# Patient Record
Sex: Female | Born: 1990 | Race: White | Hispanic: No
Health system: Southern US, Community
[De-identification: ages and names within clinical notes are randomized; demographics above are authoritative.]

## PROBLEM LIST (undated history)

## (undated) DIAGNOSIS — O139 Gestational [pregnancy-induced] hypertension without significant proteinuria, unspecified trimester: Secondary | ICD-10-CM

## (undated) DIAGNOSIS — O10019 Pre-existing essential hypertension complicating pregnancy, unspecified trimester: Secondary | ICD-10-CM

## (undated) HISTORY — DX: Pre-existing essential hypertension complicating pregnancy, unspecified trimester: O10.019

## (undated) HISTORY — DX: Gestational (pregnancy-induced) hypertension without significant proteinuria, unspecified trimester: O13.9

---
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
## 2011-02-26 LAB — DIFFERENTIAL
Basophils Absolute: 0
Eosinophils Relative: 0
Lymphocytes Relative: 6 — ABNORMAL LOW
Lymphs Abs: 0.9 — ABNORMAL LOW
Monocytes Absolute: 0.8
Monocytes Relative: 5

## 2011-02-26 LAB — CBC
HCT: 37.6
Hemoglobin: 12.7
RBC: 4.39
RDW: 13.3

## 2011-02-26 LAB — URINALYSIS, ROUTINE W REFLEX MICROSCOPIC
Glucose, UA: NEGATIVE
Specific Gravity, Urine: 1.015
pH: 8

## 2011-02-26 LAB — CSF CELL COUNT WITH DIFFERENTIAL: WBC, CSF: 3

## 2011-02-26 LAB — CSF CULTURE W GRAM STAIN: Culture: NO GROWTH

## 2011-02-26 LAB — BASIC METABOLIC PANEL
BUN: 7
Chloride: 105
Potassium: 3.6
Sodium: 135

## 2011-02-26 LAB — URINE MICROSCOPIC-ADD ON

## 2011-02-26 LAB — GLUCOSE, CSF: Glucose, CSF: 51

## 2011-02-26 LAB — PROTEIN, CSF: Total  Protein, CSF: 18

## 2011-02-26 LAB — GRAM STAIN

## 2011-10-24 ENCOUNTER — Ambulatory Visit (INDEPENDENT_AMBULATORY_CARE_PROVIDER_SITE_OTHER): Payer: BC Managed Care – PPO | Admitting: Family Medicine

## 2011-10-24 VITALS — BP 129/81 | HR 90 | Temp 98.3°F | Resp 18 | Ht 62.0 in | Wt 171.2 lb

## 2011-10-24 DIAGNOSIS — Z309 Encounter for contraceptive management, unspecified: Secondary | ICD-10-CM

## 2011-10-24 DIAGNOSIS — Z3041 Encounter for surveillance of contraceptive pills: Secondary | ICD-10-CM

## 2011-10-24 MED ORDER — LEVONORGESTREL-ETHINYL ESTRAD 0.1-20 MG-MCG PO TABS: 1.0000 | ORAL_TABLET | Freq: Every day | ORAL | Status: DC

## 2011-10-26 ENCOUNTER — Encounter: Payer: Self-pay | Admitting: Family Medicine

## 2011-10-26 DIAGNOSIS — Z309 Encounter for contraceptive management, unspecified: Secondary | ICD-10-CM | POA: Insufficient documentation

## 2011-10-26 NOTE — Progress Notes (Signed)
  Subjective:    Patient ID: Shelley Pruitt, female    DOB: 1990/10/31, 21 y.o.   MRN: [id number]  HPI  Healthy 21 y.o. Cauc female here for OCP refill. She is compliant with medication and tolerates it  very well without side effects. She has no HA, SOB, CP, palpitations, dizziness, excess weight gain,  moodiness or unusual bleeding or vaginal discharge. Last PAP was less than 2 years ago.    Review of Systems As per HPI     Objective:   Physical Exam  Nursing note and vitals reviewed. Constitutional: She is oriented to person, place, and time. She appears well-developed and well-nourished. No distress.  HENT:  Head: Normocephalic and atraumatic.  Mouth/Throat: Oropharynx is clear and moist.  Eyes: Conjunctivae and EOM are normal. No scleral icterus.  Neck: Neck supple. No thyromegaly present.  Cardiovascular: Normal rate, regular rhythm and normal heart sounds.   No murmur heard. Pulmonary/Chest: Effort normal and breath sounds normal. No respiratory distress.  Abdominal: Soft. She exhibits no mass. There is no tenderness. There is no guarding.  Musculoskeletal: Normal range of motion. She exhibits no edema.  Neurological: She is alert and oriented to person, place, and time. No cranial nerve deficit. Coordination normal.  Skin: Skin is warm and dry.  Psychiatric: She has a normal mood and affect. Her behavior is normal.          Assessment & Plan:   1. Contraception management   2. Uses oral contraception   RF: Levonorgestrel-ethinyl estradiol  0.1- 20 mg-mcg  X 12 months RTC for CPE/PAP within next 12 months.

## 2012-09-29 ENCOUNTER — Other Ambulatory Visit: Payer: Self-pay | Admitting: Family Medicine

## 2012-09-30 ENCOUNTER — Telehealth: Payer: Self-pay

## 2012-09-30 NOTE — Telephone Encounter (Signed)
Was sent in

## 2012-09-30 NOTE — Telephone Encounter (Signed)
Pt's mother is calling to see if her daughter could have one more refill on her birth control that would last her till she has an apt with a new dr Pharmacy is [facility] [address]  Mother is on old HIPPA form i believe its circled that could talk to parents Mothers call back number is [phone]

## 2012-10-01 ENCOUNTER — Telehealth: Payer: Self-pay

## 2012-10-01 NOTE — Telephone Encounter (Signed)
Patients mom is calling to request refill on daughters birth control please call mom janelle

## 2012-10-02 MED ORDER — LEVONORGESTREL-ETHINYL ESTRAD 0.1-20 MG-MCG PO TABS: 1.0000 | ORAL_TABLET | Freq: Every day | ORAL | Status: DC

## 2012-10-02 NOTE — Telephone Encounter (Signed)
Called mom, LMOM Rs sent in advised needs office visit for more refills.

## 2012-10-02 NOTE — Telephone Encounter (Signed)
Ok x 1. Needs office visit before out per Dr. Angelyn Punt note.

## 2018-05-28 ENCOUNTER — Other Ambulatory Visit: Payer: Self-pay | Admitting: Nurse Practitioner

## 2018-05-28 DIAGNOSIS — N6001 Solitary cyst of right breast: Secondary | ICD-10-CM

## 2018-06-16 ENCOUNTER — Ambulatory Visit
Admission: RE | Admit: 2018-06-16 | Discharge: 2018-06-16 | Disposition: A | Discharge status: Home / Self Care | Payer: Managed Care, Other (non HMO) | Source: Ambulatory Visit | Attending: Nurse Practitioner | Admitting: Nurse Practitioner

## 2018-06-16 ENCOUNTER — Other Ambulatory Visit: Payer: Self-pay | Admitting: Nurse Practitioner

## 2018-06-16 ENCOUNTER — Other Ambulatory Visit: Payer: Self-pay | Admitting: Surgery

## 2018-06-16 DIAGNOSIS — N6310 Unspecified lump in the right breast, unspecified quadrant: Secondary | ICD-10-CM

## 2018-06-16 DIAGNOSIS — N6001 Solitary cyst of right breast: Secondary | ICD-10-CM

## 2018-06-21 ENCOUNTER — Other Ambulatory Visit: Payer: Self-pay

## 2018-06-21 ENCOUNTER — Encounter (HOSPITAL_BASED_OUTPATIENT_CLINIC_OR_DEPARTMENT_OTHER): Payer: Self-pay | Admitting: *Deleted

## 2018-06-22 NOTE — Progress Notes (Signed)
Ensure pre surgery drink given with instructions to complete by 1100 dos, pt verbalized understanding. 

## 2018-07-05 ENCOUNTER — Other Ambulatory Visit: Payer: Self-pay | Admitting: Surgery

## 2018-07-05 NOTE — Progress Notes (Signed)
SZP #[id number] - 72 hours no orders

## 2018-07-07 NOTE — H&P (Signed)
  Shelley Pruitt Documented: 06/16/2018 3:40 PM Location: [facility] Patient #: [id number] DOB: [birth year] Married / Language: [place] / Race: White Female   History of Present Illness (Shelley Pruitt A. Magnus Ivan MD; 06/16/2018 4:12 PM) The patient is a 28 year old female who presents with a breast mass. This is a pleasant 28 year old female who was seen here back in November 2018 with a fibroadenoma of the right breast. It is now gotten larger and more symptomatic. Most recent ultrasound shows it to be 9.9 cm in size. She just had an ultrasound-guided biopsy of it several hours ago and I do not have those results yet. There is no family history of breast cancer and she has no nipple discharge. She notices the mass much more now and is becoming uncomfortable.   Allergies [facility], RMA; 06/16/2018 3:41 PM) No Known Allergies [03/19/2017]: Allergies Reconciled   Medication History [facility], RMA; 06/16/2018 3:41 PM) Multi Vitamin (Oral) Active. Medications Reconciled  Vitals (Jacqueline Haggett RMA; 06/16/2018 3:41 PM) 06/16/2018 3:41 PM Weight: 197.8 lb Height: 63in Body Surface Area: 1.92 m Body Mass Index: 35.04 kg/m  Temp.: 97.70F(Temporal)  Pulse: 111 (Regular)  P.OX: 98% (Room air) BP: 162/84 (Sitting, Right Arm, Standard)       Physical Exam (Ziv Welchel A. Magnus Ivan MD; 06/16/2018 4:13 PM) The physical exam findings are as follows: Note:On examination, she is well. Lungs are clear bilaterally Cardiovascular regular rate and rhythm There is a large mobile mass at the 12 to 1 o'clock position of the right breast above the areola. There are no other masses and no axillary adenopathy    Assessment & Plan (Guinevere Stephenson A. 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 Right breast lumpectomy is recommended. She and her family are  a proceed with surgery given her symptoms. I discussed the surgery in detail. I discussed the risk which includes but is not limited to bleeding, infection, recurrence, need for further surgery, cardiopulmonary issues, postoperative recovery, etc. She understands and wishes to proceed with surgery which will be scheduled

## 2018-07-08 ENCOUNTER — Ambulatory Visit (HOSPITAL_BASED_OUTPATIENT_CLINIC_OR_DEPARTMENT_OTHER): Payer: Managed Care, Other (non HMO) | Admitting: Anesthesiology

## 2018-07-08 ENCOUNTER — Other Ambulatory Visit: Payer: Self-pay

## 2018-07-08 ENCOUNTER — Encounter (HOSPITAL_BASED_OUTPATIENT_CLINIC_OR_DEPARTMENT_OTHER)
Admission: RE | Disposition: A | Discharge status: Home / Self Care | Payer: Self-pay | Source: Ambulatory Visit | Attending: Surgery

## 2018-07-08 ENCOUNTER — Encounter (HOSPITAL_BASED_OUTPATIENT_CLINIC_OR_DEPARTMENT_OTHER): Payer: Self-pay

## 2018-07-08 ENCOUNTER — Ambulatory Visit (HOSPITAL_BASED_OUTPATIENT_CLINIC_OR_DEPARTMENT_OTHER)
Admission: RE | Admit: 2018-07-08 | Discharge: 2018-07-08 | Disposition: A | Discharge status: Home / Self Care | Payer: Managed Care, Other (non HMO) | Source: Ambulatory Visit | Attending: Surgery | Admitting: Surgery

## 2018-07-08 DIAGNOSIS — Q859 Phakomatosis, unspecified: Secondary | ICD-10-CM | POA: Insufficient documentation

## 2018-07-08 HISTORY — PX: BREAST LUMPECTOMY: SHX2

## 2018-07-08 SURGERY — BREAST LUMPECTOMY
Anesthesia: General | Site: Breast | Laterality: Right

## 2018-07-08 MED ORDER — BUPIVACAINE-EPINEPHRINE (PF) 0.25% -1:200000 IJ SOLN
INTRAMUSCULAR | Status: DC | PRN
  Administered 2018-07-08: 20 mL

## 2018-07-08 MED ORDER — GABAPENTIN 300 MG PO CAPS
300.0000 mg | ORAL_CAPSULE | ORAL | Status: AC
  Administered 2018-07-08: 300 mg via ORAL

## 2018-07-08 MED ORDER — METOCLOPRAMIDE HCL 5 MG/ML IJ SOLN: 10.0000 mg | Freq: Once | INTRAMUSCULAR | Status: DC | PRN

## 2018-07-08 MED ORDER — LIDOCAINE HCL (CARDIAC) PF 100 MG/5ML IV SOSY
PREFILLED_SYRINGE | INTRAVENOUS | Status: DC | PRN
  Administered 2018-07-08: 100 mg via INTRAVENOUS

## 2018-07-08 MED ORDER — CHLORHEXIDINE GLUCONATE CLOTH 2 % EX PADS: 6.0000 | MEDICATED_PAD | Freq: Once | CUTANEOUS | Status: DC

## 2018-07-08 MED ORDER — FENTANYL CITRATE (PF) 100 MCG/2ML IJ SOLN
INTRAMUSCULAR | Status: AC
  Filled 2018-07-08: qty 2

## 2018-07-08 MED ORDER — FENTANYL CITRATE (PF) 100 MCG/2ML IJ SOLN
50.0000 ug | INTRAMUSCULAR | Status: DC | PRN
  Administered 2018-07-08: 100 ug via INTRAVENOUS

## 2018-07-08 MED ORDER — CELECOXIB 200 MG PO CAPS
200.0000 mg | ORAL_CAPSULE | ORAL | Status: AC
  Administered 2018-07-08: 200 mg via ORAL

## 2018-07-08 MED ORDER — DEXAMETHASONE SODIUM PHOSPHATE 10 MG/ML IJ SOLN
INTRAMUSCULAR | Status: DC | PRN
  Administered 2018-07-08: 8 mg via INTRAVENOUS

## 2018-07-08 MED ORDER — DEXAMETHASONE SODIUM PHOSPHATE 10 MG/ML IJ SOLN
INTRAMUSCULAR | Status: AC
  Filled 2018-07-08: qty 1

## 2018-07-08 MED ORDER — ONDANSETRON HCL 4 MG/2ML IJ SOLN
INTRAMUSCULAR | Status: AC
  Filled 2018-07-08: qty 2

## 2018-07-08 MED ORDER — SCOPOLAMINE 1 MG/3DAYS TD PT72: 1.0000 | MEDICATED_PATCH | Freq: Once | TRANSDERMAL | Status: DC | PRN

## 2018-07-08 MED ORDER — MIDAZOLAM HCL 2 MG/2ML IJ SOLN
INTRAMUSCULAR | Status: AC
  Filled 2018-07-08: qty 2

## 2018-07-08 MED ORDER — CEFAZOLIN SODIUM-DEXTROSE 2-4 GM/100ML-% IV SOLN
INTRAVENOUS | Status: AC
  Filled 2018-07-08: qty 100

## 2018-07-08 MED ORDER — MIDAZOLAM HCL 2 MG/2ML IJ SOLN
1.0000 mg | INTRAMUSCULAR | Status: DC | PRN
  Administered 2018-07-08: 2 mg via INTRAVENOUS

## 2018-07-08 MED ORDER — PROPOFOL 10 MG/ML IV BOLUS
INTRAVENOUS | Status: DC | PRN
  Administered 2018-07-08: 200 mg via INTRAVENOUS

## 2018-07-08 MED ORDER — ACETAMINOPHEN 500 MG PO TABS
1000.0000 mg | ORAL_TABLET | ORAL | Status: AC
  Administered 2018-07-08: 1000 mg via ORAL

## 2018-07-08 MED ORDER — CEFAZOLIN SODIUM-DEXTROSE 2-4 GM/100ML-% IV SOLN
2.0000 g | INTRAVENOUS | Status: AC
  Administered 2018-07-08: 2 g via INTRAVENOUS

## 2018-07-08 MED ORDER — ACETAMINOPHEN 500 MG PO TABS
ORAL_TABLET | ORAL | Status: AC
  Filled 2018-07-08: qty 2

## 2018-07-08 MED ORDER — FENTANYL CITRATE (PF) 100 MCG/2ML IJ SOLN: 25.0000 ug | INTRAMUSCULAR | Status: DC | PRN

## 2018-07-08 MED ORDER — OXYCODONE HCL 5 MG PO TABS: 5.0000 mg | ORAL_TABLET | Freq: Four times a day (QID) | ORAL | 0 refills | Status: DC | PRN

## 2018-07-08 MED ORDER — MEPERIDINE HCL 25 MG/ML IJ SOLN: 6.2500 mg | INTRAMUSCULAR | Status: DC | PRN

## 2018-07-08 MED ORDER — GABAPENTIN 300 MG PO CAPS
ORAL_CAPSULE | ORAL | Status: AC
  Filled 2018-07-08: qty 1

## 2018-07-08 MED ORDER — LACTATED RINGERS IV SOLN: INTRAVENOUS | Status: DC

## 2018-07-08 MED ORDER — LACTATED RINGERS IV SOLN
INTRAVENOUS | Status: DC
  Administered 2018-07-08: 13:00:00 via INTRAVENOUS

## 2018-07-08 MED ORDER — CELECOXIB 200 MG PO CAPS
ORAL_CAPSULE | ORAL | Status: AC
  Filled 2018-07-08: qty 1

## 2018-07-08 MED ORDER — ONDANSETRON HCL 4 MG/2ML IJ SOLN
INTRAMUSCULAR | Status: DC | PRN
  Administered 2018-07-08: 4 mg via INTRAVENOUS

## 2018-07-08 SURGICAL SUPPLY — 48 items
ADH SKN CLS APL DERMABOND .7 (GAUZE/BANDAGES/DRESSINGS) ×1
BLADE HEX COATED 2.75 (ELECTRODE) ×3 IMPLANT
BLADE SURG 15 STRL LF DISP TIS (BLADE) ×1 IMPLANT
BLADE SURG 15 STRL SS (BLADE) ×3
CANISTER SUCT 1200ML W/VALVE (MISCELLANEOUS) ×2 IMPLANT
CHLORAPREP W/TINT 26ML (MISCELLANEOUS) ×3 IMPLANT
CLIP VESOCCLUDE SM WIDE 6/CT (CLIP) IMPLANT
COVER BACK TABLE 60X90IN (DRAPES) ×3 IMPLANT
COVER MAYO STAND STRL (DRAPES) ×3 IMPLANT
COVER WAND RF STERILE (DRAPES) IMPLANT
DECANTER SPIKE VIAL GLASS SM (MISCELLANEOUS) IMPLANT
DERMABOND ADVANCED (GAUZE/BANDAGES/DRESSINGS) ×2
DERMABOND ADVANCED .7 DNX12 (GAUZE/BANDAGES/DRESSINGS) ×1 IMPLANT
DRAPE LAPAROTOMY 100X72 PEDS (DRAPES) ×3 IMPLANT
DRAPE UTILITY XL STRL (DRAPES) ×3 IMPLANT
ELECT REM PT RETURN 9FT ADLT (ELECTROSURGICAL) ×3
ELECTRODE REM PT RTRN 9FT ADLT (ELECTROSURGICAL) ×1 IMPLANT
GAUZE SPONGE 4X4 12PLY STRL LF (GAUZE/BANDAGES/DRESSINGS) ×3 IMPLANT
GLOVE BIO SURGEON STRL SZ 6.5 (GLOVE) ×1 IMPLANT
GLOVE BIO SURGEON STRL SZ7 (GLOVE) ×2 IMPLANT
GLOVE BIO SURGEONS STRL SZ 6.5 (GLOVE) ×1
GLOVE BIOGEL PI IND STRL 7.0 (GLOVE) IMPLANT
GLOVE BIOGEL PI IND STRL 7.5 (GLOVE) IMPLANT
GLOVE BIOGEL PI INDICATOR 7.0 (GLOVE) ×4
GLOVE BIOGEL PI INDICATOR 7.5 (GLOVE) ×2
GLOVE SURG SIGNA 7.5 PF LTX (GLOVE) ×3 IMPLANT
GOWN STRL REUS W/ TWL LRG LVL3 (GOWN DISPOSABLE) ×1 IMPLANT
GOWN STRL REUS W/ TWL XL LVL3 (GOWN DISPOSABLE) ×1 IMPLANT
GOWN STRL REUS W/TWL LRG LVL3 (GOWN DISPOSABLE)
GOWN STRL REUS W/TWL XL LVL3 (GOWN DISPOSABLE) ×9
KIT MARKER MARGIN INK (KITS) ×1 IMPLANT
NDL HYPO 25X1 1.5 SAFETY (NEEDLE) ×1 IMPLANT
NEEDLE HYPO 25X1 1.5 SAFETY (NEEDLE) ×3 IMPLANT
NS IRRIG 1000ML POUR BTL (IV SOLUTION) ×1 IMPLANT
PACK BASIN DAY SURGERY FS (CUSTOM PROCEDURE TRAY) ×3 IMPLANT
PENCIL BUTTON HOLSTER BLD 10FT (ELECTRODE) ×3 IMPLANT
SLEEVE SCD COMPRESS KNEE MED (MISCELLANEOUS) ×2 IMPLANT
SPONGE LAP 4X18 RFD (DISPOSABLE) ×5 IMPLANT
SUT MNCRL AB 4-0 PS2 18 (SUTURE) ×3 IMPLANT
SUT SILK 2 0 SH (SUTURE) ×3 IMPLANT
SUT VIC AB 3-0 SH 27 (SUTURE) ×3
SUT VIC AB 3-0 SH 27X BRD (SUTURE) ×1 IMPLANT
SYR CONTROL 10ML LL (SYRINGE) ×3 IMPLANT
TOWEL GREEN STERILE FF (TOWEL DISPOSABLE) ×3 IMPLANT
TRAY FAXITRON CT DISP (TRAY / TRAY PROCEDURE) IMPLANT
TUBE CONNECTING 20'X1/4 (TUBING) ×1
TUBE CONNECTING 20X1/4 (TUBING) ×1 IMPLANT
YANKAUER SUCT BULB TIP NO VENT (SUCTIONS) ×2 IMPLANT

## 2018-07-08 NOTE — Interval H&P Note (Signed)
History and Physical Interval Note:no change in H and P  07/08/2018 12:59 PM  Shelley Pruitt  has presented today for surgery, with the diagnosis of RIGHT BREAST GIANT FIBROADENOMA  The various methods of treatment have been discussed with the patient and family. After consideration of risks, benefits and other options for treatment, the patient has consented to  Procedure(s): RIGHT BREAST LUMPECTOMY (Right) as a surgical intervention .  The patient's history has been reviewed, patient examined, no change in status, stable for surgery.  I have reviewed the patient's chart and labs.  Questions were answered to the patient's satisfaction.     Abigail Miyamoto

## 2018-07-08 NOTE — Transfer of Care (Signed)
Immediate Anesthesia Transfer of Care Note  Patient: Shelley Pruitt  Procedure(s) Performed: RIGHT BREAST LUMPECTOMY (Right Breast)  Patient Location: PACU  Anesthesia Type:General  Level of Consciousness: awake, alert  and oriented  Airway & Oxygen Therapy: Patient Spontanous Breathing and Patient connected to face mask oxygen  Post-op Assessment: Report given to RN and Post -op Vital signs reviewed and stable  Post vital signs: Reviewed and stable  Last Vitals:  Vitals Value Taken Time  BP    Temp    Pulse    Resp    SpO2      Last Pain:  Vitals:   07/08/18 1229  TempSrc: Oral         Complications: No apparent anesthesia complications

## 2018-07-08 NOTE — Anesthesia Procedure Notes (Signed)
Procedure Name: LMA Insertion Date/Time: 07/08/2018 1:27 PM Performed by: Shanon Payor, CRNA Pre-anesthesia Checklist: Patient identified, Emergency Drugs available, Suction available, Patient being monitored and Timeout performed Patient Re-evaluated:Patient Re-evaluated prior to induction Oxygen Delivery Method: Circle system utilized Preoxygenation: Pre-oxygenation with 100% oxygen Induction Type: IV induction LMA: LMA inserted LMA Size: 4.0 Number of attempts: 1 Placement Confirmation: positive ETCO2 and breath sounds checked- equal and bilateral Tube secured with: Tape Dental Injury: Teeth and Oropharynx as per pre-operative assessment

## 2018-07-08 NOTE — Anesthesia Preprocedure Evaluation (Signed)
Anesthesia Evaluation  Patient identified by MRN, date of birth, ID band Patient awake    Reviewed: Allergy & Precautions, NPO status , Patient's Chart, lab work & pertinent test results  Airway Mallampati: II  TM Distance: >3 FB Neck ROM: Full    Dental no notable dental hx.    Pulmonary neg pulmonary ROS,    Pulmonary exam normal breath sounds clear to auscultation       Cardiovascular negative cardio ROS Normal cardiovascular exam Rhythm:Regular Rate:Normal     Neuro/Psych negative neurological ROS  negative psych ROS   GI/Hepatic negative GI ROS, Neg liver ROS,   Endo/Other  negative endocrine ROS  Renal/GU negative Renal ROS  negative genitourinary   Musculoskeletal negative musculoskeletal ROS (+)   Abdominal   Peds negative pediatric ROS (+)  Hematology negative hematology ROS (+)   Anesthesia Other Findings   Reproductive/Obstetrics negative OB ROS                             Anesthesia Physical Anesthesia Plan  ASA: II  Anesthesia Plan: General   Post-op Pain Management:    Induction: Intravenous  PONV Risk Score and Plan: 3 and Ondansetron, Dexamethasone, Midazolam and Treatment may vary due to age or medical condition  Airway Management Planned: LMA  Additional Equipment:   Intra-op Plan:   Post-operative Plan: Extubation in OR  Informed Consent: I have reviewed the patients History and Physical, chart, labs and discussed the procedure including the risks, benefits and alternatives for the proposed anesthesia with the patient or authorized representative who has indicated his/her understanding and acceptance.     Dental advisory given  Plan Discussed with: CRNA  Anesthesia Plan Comments:         Anesthesia Quick Evaluation  

## 2018-07-08 NOTE — Op Note (Signed)
RIGHT BREAST LUMPECTOMY  Procedure Note  LEINA BAS 07/08/2018   Pre-op Diagnosis: RIGHT BREAST MASS     Post-op Diagnosis: same  Procedure(s): RIGHT BREAST LUMPECTOMY  Surgeon(s): Abigail Miyamoto, MD  Anesthesia: General  Staff:  Circulator: Raliegh Scarlet, RN Scrub Person: Monika Salk, CST; McDonough-Hughes, Maceo Pro, RN  Estimated Blood Loss: Minimal               Specimens: sent to path  Indications: This is a 28 year old female with almost a 10 cm right breast mass.  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  The decision was made to proceed with a right breast lumpectomy  Procedure: The patient was brought to the operating room and identified as correct patient.  She is placed upon the operating table and general anesthesia was induced.  Her right breast was then prepped and draped in usual sterile fashion.  The very large mass was at the 12 and 1 o'clock position of the right breast.  I anesthetized the skin around the areola with Marcaine.  I then made a circumareolar incision with a scalpel.  I then dissected down to the breast tissue with electrocautery.  I then dissected superiorly with the cautery and identified the large mass.  I excised the mass in its entirety with the electrocautery.  I was then able to elevated up out of the incision and complete the excision with the cautery.  The mass was soft.  It was sent to pathology for evaluation.  No other abnormalities were identified.  I achieved hemostasis with the cautery.  I irrigated the wound with saline.  I then anesthetized further with Marcaine.  I then closed subcutaneous tissue with interrupted 3-0 Vicryl sutures and closed the skin with a running 4-0 Monocryl.  Dermabond was then applied.  The patient tolerated the procedure well.  All sponge needle and instrument counts were correct at the end of the procedure.  The patient was then extubated in the operating room and taken a stable  condition to the recovery room.          Abigail Miyamoto   Date: 07/08/2018  Time: 2:03 PM

## 2018-07-08 NOTE — Discharge Instructions (Signed)
[facility] Office Phone Number [phone]  BREAST BIOPSY/ LUMPECTOMY: POST OP INSTRUCTIONS  Always review your discharge instruction sheet given to you by the facility where your surgery was performed.  IF YOU HAVE DISABILITY OR FAMILY LEAVE FORMS, YOU MUST BRING THEM TO THE OFFICE FOR PROCESSING.  DO NOT GIVE THEM TO YOUR DOCTOR.  1. A prescription for pain medication may be given to you upon discharge.  Take your pain medication as prescribed, if needed.  If narcotic pain medicine is not needed, then you may take acetaminophen (Tylenol) or ibuprofen (Advil) as needed. 2. Take your usually prescribed medications unless otherwise directed 3. If you need a refill on your pain medication, please contact your pharmacy.  They will contact our office to request authorization.  Prescriptions will not be filled after 5pm or on week-ends. 4. You should eat very light the first 24 hours after surgery, such as soup, crackers, pudding, etc.  Resume your normal diet the day after surgery. 5. Most patients will experience some swelling and bruising in the breast.  Ice packs and a good support bra will help.  Swelling and bruising can take several days to resolve.  6. It is common to experience some constipation if taking pain medication after surgery.  Increasing fluid intake and taking a stool softener will usually help or prevent this problem from occurring.  A mild laxative (Milk of Magnesia or Miralax) should be taken according to package directions if there are no bowel movements after 48 hours. 7. Unless discharge instructions indicate otherwise, you may remove your bandages 24-48 hours after surgery, and you may shower at that time.  You may have steri-strips (small skin tapes) in place directly over the incision.  These strips should be left on the skin for 7-10 days.  If your surgeon used skin glue on the incision, you may shower in 24 hours.  The glue will flake off over the next 2-3  weeks.  Any sutures or staples will be removed at the office during your follow-up visit. 8. ACTIVITIES:  You may resume regular daily activities (gradually increasing) beginning the next day.  Wearing a good support bra or sports bra minimizes pain and swelling.  You may have sexual intercourse when it is comfortable. a. You may drive when you no longer are taking prescription pain medication, you can comfortably wear a seatbelt, and you can safely maneuver your car and apply brakes. b. RETURN TO WORK:  ______________________________________________________________________________________ 9. You should see your doctor in the office for a follow-up appointment approximately two weeks after your surgery.  Your doctors nurse will typically make your follow-up appointment when she calls you with your pathology report.  Expect your pathology report 2-3 business days after your surgery.  You may call to check if you do not hear from [place] after three days. 10. OTHER INSTRUCTIONS: 11.  12.  13. OK TO SHOWER STARTING TOMORROW 14. YOU CAN REMOVE THE BINDER LATER TODAY IF YOU WANT TO OR KEEP IT ON FOR COMFORT 15. ICE PACK, TYLENOL, IBUPROFEN ALSO FOR PAIN 16. NO VIGOROUS ACTIVITY FOR ONE WEEKS ________________________   WHEN TO CALL YOUR DOCTOR: 1. Fever over 101.0 2. Nausea and/or vomiting. 3. Extreme swelling or bruising. 4. Continued bleeding from incision. 5. Increased pain, redness, or drainage from the incision.  The clinic staff is available to answer your questions during regular business hours.  Please dont hesitate to call and ask to speak to one of the nurses for clinical  concerns.  If you have a medical emergency, go to the nearest emergency room or call 911.  A surgeon from [facility] is always on call at the hospital.  For further questions, please visit [email or web address]    Post Anesthesia Home Care Instructions  Activity: Get plenty of rest for the remainder  of the day. A responsible individual must stay with you for 24 hours following the procedure.  For the next 24 hours, DO NOT: -Drive a car -Advertising copywriter -Drink alcoholic beverages -Take any medication unless instructed by your physician -Make any legal decisions or sign important papers.  Meals: Start with liquid foods such as gelatin or soup. Progress to regular foods as tolerated. Avoid greasy, spicy, heavy foods. If nausea and/or vomiting occur, drink only clear liquids until the nausea and/or vomiting subsides. Call your physician if vomiting continues.  Special Instructions/Symptoms: Your throat may feel dry or sore from the anesthesia or the breathing tube placed in your throat during surgery. If this causes discomfort, gargle with warm salt water. The discomfort should disappear within 24 hours.  If you had a scopolamine patch placed behind your ear for the management of post- operative nausea and/or vomiting:  1. The medication in the patch is effective for 72 hours, after which it should be removed.  Wrap patch in a tissue and discard in the trash. Wash hands thoroughly with soap and water. 2. You may remove the patch earlier than 72 hours if you experience unpleasant side effects which may include dry mouth, dizziness or visual disturbances. 3. Avoid touching the patch. Wash your hands with soap and water after contact with the patch.

## 2018-07-08 NOTE — Anesthesia Postprocedure Evaluation (Signed)
Anesthesia Post Note  Patient: Shelley Pruitt  Procedure(s) Performed: RIGHT BREAST LUMPECTOMY (Right Breast)     Patient location during evaluation: PACU Anesthesia Type: General Level of consciousness: awake and alert Pain management: pain level controlled Vital Signs Assessment: post-procedure vital signs reviewed and stable Respiratory status: spontaneous breathing, nonlabored ventilation, respiratory function stable and patient connected to nasal cannula oxygen Cardiovascular status: blood pressure returned to baseline and stable Postop Assessment: no apparent nausea or vomiting Anesthetic complications: no    Last Vitals:  Vitals:   07/08/18 1435 07/08/18 1500  BP:  120/69  Pulse: 84 82  Resp: 17 16  Temp:  36.7 C  SpO2: 99% 98%    Last Pain:  Vitals:   07/08/18 1500  TempSrc:   PainSc: 0-No pain                 Phillips Grout

## 2018-07-09 ENCOUNTER — Encounter (HOSPITAL_BASED_OUTPATIENT_CLINIC_OR_DEPARTMENT_OTHER): Payer: Self-pay | Admitting: Surgery

## 2018-10-29 ENCOUNTER — Inpatient Hospital Stay (HOSPITAL_COMMUNITY)
Admission: AD | Admit: 2018-10-29 | Discharge: 2018-10-29 | Disposition: A | Discharge status: Home Health | Payer: Managed Care, Other (non HMO) | Attending: Obstetrics & Gynecology | Admitting: Obstetrics & Gynecology

## 2018-10-29 ENCOUNTER — Other Ambulatory Visit: Payer: Self-pay

## 2018-10-29 DIAGNOSIS — N939 Abnormal uterine and vaginal bleeding, unspecified: Secondary | ICD-10-CM | POA: Insufficient documentation

## 2018-10-29 DIAGNOSIS — Z3202 Encounter for pregnancy test, result negative: Secondary | ICD-10-CM | POA: Diagnosis not present

## 2018-10-29 LAB — URINALYSIS, ROUTINE W REFLEX MICROSCOPIC
Bacteria, UA: NONE SEEN
Bilirubin Urine: NEGATIVE
Glucose, UA: NEGATIVE mg/dL
Ketones, ur: NEGATIVE mg/dL
Leukocytes,Ua: NEGATIVE
Nitrite: NEGATIVE
Protein, ur: 30 mg/dL — AB
RBC / HPF: >50 RBC/hpf — ABNORMAL HIGH (ref 0–5)
Specific Gravity, Urine: 1.023 (ref 1.005–1.030)
pH: 6 (ref 5.0–8.0)

## 2018-10-29 LAB — CBC
HCT: 42 % (ref 36.0–46.0)
Hemoglobin: 14 g/dL (ref 12.0–15.0)
MCH: 28.7 pg (ref 26.0–34.0)
MCHC: 33.3 g/dL (ref 30.0–36.0)
MCV: 86.2 fL (ref 80.0–100.0)
Platelets: 386 10*3/uL (ref 150–400)
RBC: 4.87 MIL/uL (ref 3.87–5.11)
RDW: 12.7 % (ref 11.5–15.5)
WBC: 9.2 10*3/uL (ref 4.0–10.5)
nRBC: 0 % (ref 0.0–0.2)

## 2018-10-29 LAB — ABO/RH: ABO/RH(D): A POS

## 2018-10-29 LAB — HCG, QUANTITATIVE, PREGNANCY: hCG, Beta Chain, Quant, S: 3 m[IU]/mL (ref <5)

## 2018-10-29 LAB — POCT PREGNANCY, URINE: Preg Test, Ur: NEGATIVE

## 2018-10-29 NOTE — MAU Note (Signed)
Patient states had positive pregnancy test at MD Monday.  Started bleeding on Wed, called MD.  MD said to monitor since she had intercourse on Tues.  Bleeding continued to increase so she came in.  Saturating pad every 2 hours with stringy clots

## 2018-10-29 NOTE — MAU Note (Signed)
Positive HPT. Started bleeding, denies pain.very anxious

## 2018-10-29 NOTE — Discharge Instructions (Signed)
Vaginal Bleeding During Pregnancy, First Trimester ° °A small amount of bleeding from the vagina (spotting) is relatively common during early pregnancy. It usually stops on its own. Various things may cause bleeding or spotting during early pregnancy. Some bleeding may be related to the pregnancy, and some may not. In many cases, the bleeding is normal and is not a problem. However, bleeding can also be a sign of something serious. Be sure to tell your health care provider about any vaginal bleeding right away. °Some possible causes of vaginal bleeding during the first trimester include: °· Infection or inflammation of the cervix. °· Growths (polyps) on the cervix. °· Miscarriage or threatened miscarriage. °· Pregnancy tissue developing outside of the uterus (ectopic pregnancy). °· A mass of tissue developing in the uterus due to an egg being fertilized incorrectly (molar pregnancy). °Follow these instructions at home: °Activity °· Follow instructions from your health care provider about limiting your activity. Ask what activities are safe for you. °· If needed, make plans for someone to help with your regular activities. °· Do not have sex or orgasms until your health care provider says that this is safe. °General instructions °· Take over-the-counter and prescription medicines only as told by your health care provider. °· Pay attention to any changes in your symptoms. °· Do not use tampons or douche. °· Write down how many pads you use each day, how often you change pads, and how soaked (saturated) they are. °· If you pass any tissue from your vagina, save the tissue so you can show it to your health care provider. °· Keep all follow-up visits as told by your health care provider. This is important. °Contact a health care provider if: °· You have vaginal bleeding during any part of your pregnancy. °· You have cramps or labor pains. °· You have a fever. °Get help right away if: °· You have severe cramps in your  back or abdomen. °· You pass large clots or a large amount of tissue from your vagina. °· Your bleeding increases. °· You feel light-headed or weak, or you faint. °· You have chills. °· You are leaking fluid or have a gush of fluid from your vagina. °Summary °· A small amount of bleeding (spotting) from the vagina is relatively common during early pregnancy. °· Various things may cause bleeding or spotting in early pregnancy. °· Be sure to tell your health care provider about any vaginal bleeding right away. °This information is not intended to replace advice given to you by your health care provider. Make sure you discuss any questions you have with your health care provider. °Document Released: 02/05/2005 Document Revised: 07/31/2016 Document Reviewed: 07/31/2016 °Elsevier Interactive Patient Education © 2019 [employer] ° °

## 2018-10-29 NOTE — MAU Provider Note (Signed)
Chief Complaint: Vaginal Bleeding and Possible Pregnancy   First Provider Initiated Contact with Patient 10/29/18 1304     SUBJECTIVE HPI: Shelley Pruitt is a 28 y.o. No obstetric history on file. at Unknown who presents to Maternity Admissions reporting vaginal bleeding. Had a positive pregnancy test at home on Monday. Has not been seen in office yet but normally sees Dr. Richardson Doppole for her gyn care.  Started having vaginal bleeding yesterday morning. States bleeding is like a light period and has had some tiny blood clots. Blood is pink. Not saturating pads. Denies abdominal pain. LMP 5/18.  No past medical history on file. OB History  No obstetric history on file.   Past Surgical History:  Procedure Laterality Date  . BREAST LUMPECTOMY Right 07/08/2018   Procedure: RIGHT BREAST LUMPECTOMY;  Surgeon: Abigail MiyamotoBlackman, Douglas, MD;  Location: [facility];  Service: General;  Laterality: Right;   Social History   Socioeconomic History  . Marital status: Married    Spouse name: Not on file  . Number of children: Not on file  . Years of education: Not on file  . Highest education level: Not on file  Occupational History  . Not on file  Social Needs  . Financial resource strain: Not on file  . Food insecurity    Worry: Not on file    Inability: Not on file  . Transportation needs    Medical: Not on file    Non-medical: Not on file  Tobacco Use  . Smoking status: Never Smoker  . Smokeless tobacco: Never Used  Substance and Sexual Activity  . Alcohol use: Never    Frequency: Never  . Drug use: No  . Sexual activity: Yes    Birth control/protection: Pill, None  Lifestyle  . Physical activity    Days per week: Not on file    Minutes per session: Not on file  . Stress: Not on file  Relationships  . Social Musicianconnections    Talks on phone: Not on file    Gets together: Not on file    Attends religious service: Not on file    Active member of club or organization: Not on file     Attends meetings of clubs or organizations: Not on file    Relationship status: Not on file  . Intimate partner violence    Fear of current or ex partner: Not on file    Emotionally abused: Not on file    Physically abused: Not on file    Forced sexual activity: Not on file  Other Topics Concern  . Not on file  Social History Narrative  . Not on file   No family history on file. No current facility-administered medications on file prior to encounter.    Current Outpatient Medications on File Prior to Encounter  Medication Sig Dispense Refill  . Multiple Vitamin (MULTIVITAMIN) capsule Take 1 capsule by mouth daily.    Marland Kitchen. oxyCODONE (OXY IR/ROXICODONE) 5 MG immediate release tablet Take 1 tablet (5 mg total) by mouth every 6 (six) hours as needed for moderate pain or severe pain. 25 tablet 0   No Known Allergies  I have reviewed patient's Past Medical Hx, Surgical Hx, Family Hx, Social Hx, medications and allergies.   Review of Systems  Constitutional: Negative.   Gastrointestinal: Negative.   Genitourinary: Positive for vaginal bleeding.    OBJECTIVE Patient Vitals for the past 24 hrs:  BP Temp Temp src Pulse Resp SpO2 Height Weight  10/29/18  1343 (!) 148/82 - - 86 18 100 % - -  10/29/18 1251 (!) 150/93 98 F (36.7 C) Oral (!) 104 20 100 % 5' 2.4" (1.585 m) 88.5 kg   Constitutional: Well-developed, well-nourished female in no acute distress.  Cardiovascular: normal rate & rhythm, no murmur Respiratory: normal rate and effort. Lung sounds clear throughout GI: Abd soft, non-tender, Pos BS x 4. No guarding or rebound tenderness MS: Extremities nontender, no edema, normal ROM Neurologic: Alert and oriented x 4.   LAB RESULTS Results for orders placed or performed during the hospital encounter of 10/29/18 (from the past 24 hour(s))  Urinalysis, Routine w reflex microscopic     Status: Abnormal   Collection Time: 10/29/18 12:57 PM  Result Value Ref Range   Color, Urine  YELLOW YELLOW   APPearance CLEAR CLEAR   Specific Gravity, Urine 1.023 1.005 - 1.030   pH 6.0 5.0 - 8.0   Glucose, UA NEGATIVE NEGATIVE mg/dL   Hgb urine dipstick LARGE (A) NEGATIVE   Bilirubin Urine NEGATIVE NEGATIVE   Ketones, ur NEGATIVE NEGATIVE mg/dL   Protein, ur 30 (A) NEGATIVE mg/dL   Nitrite NEGATIVE NEGATIVE   Leukocytes,Ua NEGATIVE NEGATIVE   RBC / HPF >50 (H) 0 - 5 RBC/hpf   WBC, UA 0-5 0 - 5 WBC/hpf   Bacteria, UA NONE SEEN NONE SEEN   Squamous Epithelial / LPF 0-5 0 - 5   Mucus PRESENT   Pregnancy, urine POC     Status: None   Collection Time: 10/29/18 12:58 PM  Result Value Ref Range   Preg Test, Ur NEGATIVE NEGATIVE  hCG, quantitative, pregnancy     Status: None   Collection Time: 10/29/18  1:23 PM  Result Value Ref Range   hCG, Beta Chain, Quant, S 3 <5 mIU/mL  ABO/Rh     Status: None   Collection Time: 10/29/18  1:23 PM  Result Value Ref Range   ABO/RH(D) A POS    No rh immune globuloin      NOT A RH IMMUNE GLOBULIN CANDIDATE, PT RH POSITIVE Performed at [facility], [address], [place], [place] [address]   CBC     Status: None   Collection Time: 10/29/18  1:23 PM  Result Value Ref Range   WBC 9.2 4.0 - 10.5 K/uL   RBC 4.87 3.87 - 5.11 MIL/uL   Hemoglobin 14.0 12.0 - 15.0 g/dL   HCT 42.0 36.0 - 46.0 %   MCV 86.2 80.0 - 100.0 fL   MCH 28.7 26.0 - 34.0 pg   MCHC 33.3 30.0 - 36.0 g/dL   RDW 12.7 11.5 - 15.5 %   Platelets 386 150 - 400 K/uL   nRBC 0.0 0.0 - 0.2 %    IMAGING No results found.  MAU COURSE Orders Placed This Encounter  Procedures  . Urinalysis, Routine w reflex microscopic  . hCG, quantitative, pregnancy  . CBC  . Pregnancy, urine POC  . ABO/Rh  . Discharge patient   No orders of the defined types were placed in this encounter.   MDM UPT negative HCG 3 RH positive  ASSESSMENT 1. Negative pregnancy test     PLAN Discharge home in stable condition. Likely miscarriage  F/u with [facility] Discussed reasons  to return to MAU  Allergies as of 10/29/2018   No Known Allergies     Medication List    STOP taking these medications   oxyCODONE 5 MG immediate release tablet Commonly known as:  Oxy IR/ROXICODONE     TAKE these medications   multivitamin capsule Take 1 capsule by mouth daily.        Judeth HornLawrence, Akeela Busk, NP 10/29/2018  3:02 PM

## 2018-10-29 NOTE — MAU Note (Signed)
Pt advised on where to go for care by E.Lawrence,NP.

## 2019-01-04 LAB — OB RESULTS CONSOLE GC/CHLAMYDIA
Chlamydia: NEGATIVE
Gonorrhea: NEGATIVE

## 2019-02-01 LAB — OB RESULTS CONSOLE GBS: GBS: POSITIVE

## 2019-02-01 LAB — OB RESULTS CONSOLE RPR: RPR: NONREACTIVE

## 2019-02-01 LAB — OB RESULTS CONSOLE RUBELLA ANTIBODY, IGM
Rubella: IMMUNE
Rubella: IMMUNE

## 2019-02-01 LAB — OB RESULTS CONSOLE HIV ANTIBODY (ROUTINE TESTING)
HIV: NONREACTIVE
HIV: NONREACTIVE

## 2019-02-01 LAB — OB RESULTS CONSOLE ABO/RH: RH Type: POSITIVE

## 2019-02-01 LAB — OB RESULTS CONSOLE GC/CHLAMYDIA
Chlamydia: NEGATIVE
Gonorrhea: NEGATIVE

## 2019-02-01 LAB — OB RESULTS CONSOLE ANTIBODY SCREEN: Antibody Screen: NEGATIVE

## 2019-02-01 LAB — OB RESULTS CONSOLE HEPATITIS B SURFACE ANTIGEN: Hepatitis B Surface Ag: NEGATIVE

## 2019-05-13 NOTE — L&D Delivery Note (Signed)
Delivery Note Shelley Pruitt is a G1P0 at [redacted]w[redacted]d who had a spontaneous delivery at 23:32 on 08/08/2019 a viable female "[name]" was delivered via LOA.  APGAR:  7, 9 ; weight 3731 g (8lb3.6oz)  Admitted for induction of labor for CHTN at [redacted]w[redacted]d. Induced with cytotec x2 and then pitocin and AROM. Progressed normally. Received an epidural for pain management. Pushed for 2 hours 50 minutes. Head delivered at 23:31, shoulder dystocia recognized, and McRoberts, suprapubic pressure were performed, ultimately the left posterior arm was delivered after 55 seconds. No nuchal cord. Baby placed on maternal abdomen. Delayed cord clamping for 60 seconds. Delivery of placenta was spontaneous. Placenta was found to be intact, 3-vessel cord was noted. The fundus was found to be firm and the lower uterine segmant was cleared of clot x1. 1st degree perineal laceration was repaired in the normal sterile fashion with 2-0 vicryl. 10cc of lidocaine was used for additional pain management. Estimated blood loss 500cc. Cord gases sent. Instrument and gauze counts were correct at the end of the procedure.  Placenta status: L&D Cord pH: 7.12, bicarb 21.9 Mom to postpartum.  Baby to Couplet care / Skin to Skin.  Ameir Faria K Taam-Akelman 08/09/2019, 12:04 AM

## 2019-07-19 ENCOUNTER — Other Ambulatory Visit: Payer: Self-pay | Admitting: Obstetrics and Gynecology

## 2019-07-26 ENCOUNTER — Telehealth (HOSPITAL_COMMUNITY): Payer: Self-pay | Admitting: *Deleted

## 2019-07-26 ENCOUNTER — Encounter (HOSPITAL_COMMUNITY): Payer: Self-pay | Admitting: *Deleted

## 2019-07-26 NOTE — Telephone Encounter (Signed)
Preadmission screen  

## 2019-07-28 ENCOUNTER — Telehealth (HOSPITAL_COMMUNITY): Payer: Self-pay | Admitting: *Deleted

## 2019-07-28 NOTE — Telephone Encounter (Signed)
Preadmission screen  

## 2019-07-29 ENCOUNTER — Telehealth (HOSPITAL_COMMUNITY): Payer: Self-pay | Admitting: *Deleted

## 2019-07-29 ENCOUNTER — Encounter (HOSPITAL_COMMUNITY): Payer: Self-pay | Admitting: *Deleted

## 2019-07-29 NOTE — Telephone Encounter (Signed)
Preadmission screen  

## 2019-08-06 ENCOUNTER — Other Ambulatory Visit (HOSPITAL_COMMUNITY)
Admission: RE | Admit: 2019-08-06 | Discharge: 2019-08-06 | Disposition: A | Discharge status: Home / Self Care | Payer: Managed Care, Other (non HMO) | Source: Ambulatory Visit | Attending: Obstetrics and Gynecology | Admitting: Obstetrics and Gynecology

## 2019-08-06 LAB — SARS CORONAVIRUS 2 (TAT 6-24 HRS): SARS Coronavirus 2: NEGATIVE

## 2019-08-08 ENCOUNTER — Other Ambulatory Visit: Payer: Self-pay

## 2019-08-08 ENCOUNTER — Inpatient Hospital Stay (HOSPITAL_COMMUNITY): Payer: Managed Care, Other (non HMO)

## 2019-08-08 ENCOUNTER — Inpatient Hospital Stay (HOSPITAL_COMMUNITY): Payer: Managed Care, Other (non HMO) | Admitting: Anesthesiology

## 2019-08-08 ENCOUNTER — Inpatient Hospital Stay (HOSPITAL_COMMUNITY)
Admission: AD | Admit: 2019-08-08 | Discharge: 2019-08-10 | DRG: 807 | Disposition: A | Discharge status: Home / Self Care | Payer: Managed Care, Other (non HMO) | Attending: Obstetrics & Gynecology | Admitting: Obstetrics & Gynecology

## 2019-08-08 ENCOUNTER — Encounter (HOSPITAL_COMMUNITY): Payer: Self-pay | Admitting: Obstetrics and Gynecology

## 2019-08-08 DIAGNOSIS — Z3A39 39 weeks gestation of pregnancy: Secondary | ICD-10-CM

## 2019-08-08 DIAGNOSIS — E669 Obesity, unspecified: Secondary | ICD-10-CM | POA: Diagnosis present

## 2019-08-08 DIAGNOSIS — Z20822 Contact with and (suspected) exposure to covid-19: Secondary | ICD-10-CM | POA: Diagnosis present

## 2019-08-08 DIAGNOSIS — I10 Essential (primary) hypertension: Secondary | ICD-10-CM | POA: Diagnosis present

## 2019-08-08 DIAGNOSIS — O1002 Pre-existing essential hypertension complicating childbirth: Principal | ICD-10-CM | POA: Diagnosis present

## 2019-08-08 DIAGNOSIS — O99824 Streptococcus B carrier state complicating childbirth: Secondary | ICD-10-CM | POA: Diagnosis present

## 2019-08-08 DIAGNOSIS — O99214 Obesity complicating childbirth: Secondary | ICD-10-CM | POA: Diagnosis present

## 2019-08-08 LAB — COMPREHENSIVE METABOLIC PANEL
ALT: 14 U/L (ref 0–44)
AST: 23 U/L (ref 15–41)
Albumin: 2.5 g/dL — ABNORMAL LOW (ref 3.5–5.0)
Alkaline Phosphatase: 111 U/L (ref 38–126)
Anion gap: 9 (ref 5–15)
BUN: 9 mg/dL (ref 6–20)
CO2: 20 mmol/L — ABNORMAL LOW (ref 22–32)
Calcium: 9 mg/dL (ref 8.9–10.3)
Chloride: 109 mmol/L (ref 98–111)
Creatinine, Ser: 0.74 mg/dL (ref 0.44–1.00)
GFR calc Af Amer: >60 mL/min (ref >60)
GFR calc non Af Amer: >60 mL/min (ref >60)
Glucose, Bld: 78 mg/dL (ref 70–99)
Potassium: 4.3 mmol/L (ref 3.5–5.1)
Sodium: 138 mmol/L (ref 135–145)
Total Bilirubin: 0.2 mg/dL — ABNORMAL LOW (ref 0.3–1.2)
Total Protein: 5.7 g/dL — ABNORMAL LOW (ref 6.5–8.1)

## 2019-08-08 LAB — CBC
HCT: 36.5 % (ref 36.0–46.0)
Hemoglobin: 12.2 g/dL (ref 12.0–15.0)
MCH: 28.8 pg (ref 26.0–34.0)
MCHC: 33.4 g/dL (ref 30.0–36.0)
MCV: 86.1 fL (ref 80.0–100.0)
Platelets: 257 10*3/uL (ref 150–400)
RBC: 4.24 MIL/uL (ref 3.87–5.11)
RDW: 15 % (ref 11.5–15.5)
WBC: 12.8 10*3/uL — ABNORMAL HIGH (ref 4.0–10.5)
nRBC: 0 % (ref 0.0–0.2)

## 2019-08-08 LAB — TYPE AND SCREEN
ABO/RH(D): A POS
Antibody Screen: NEGATIVE

## 2019-08-08 LAB — RPR: RPR Ser Ql: NONREACTIVE

## 2019-08-08 MED ORDER — LACTATED RINGERS IV SOLN
500.0000 mL | Freq: Once | INTRAVENOUS | Status: AC
  Administered 2019-08-08: 500 mL via INTRAVENOUS

## 2019-08-08 MED ORDER — OXYTOCIN 40 UNITS IN NORMAL SALINE INFUSION - SIMPLE MED: 2.5000 [IU]/h | INTRAVENOUS | Status: DC

## 2019-08-08 MED ORDER — SODIUM CHLORIDE 0.9 % IV SOLN
5.0000 10*6.[IU] | Freq: Once | INTRAVENOUS | Status: AC
  Administered 2019-08-08: 01:00:00 5 10*6.[IU] via INTRAVENOUS
  Filled 2019-08-08: qty 5

## 2019-08-08 MED ORDER — LACTATED RINGERS IV SOLN: 500.0000 mL | INTRAVENOUS | Status: DC | PRN

## 2019-08-08 MED ORDER — TERBUTALINE SULFATE 1 MG/ML IJ SOLN: 0.2500 mg | Freq: Once | INTRAMUSCULAR | Status: DC | PRN

## 2019-08-08 MED ORDER — OXYTOCIN 40 UNITS IN NORMAL SALINE INFUSION - SIMPLE MED
1.0000 m[IU]/min | INTRAVENOUS | Status: DC
  Administered 2019-08-08: 2 m[IU]/min via INTRAVENOUS
  Filled 2019-08-08: qty 1000

## 2019-08-08 MED ORDER — LIDOCAINE HCL (PF) 1 % IJ SOLN
30.0000 mL | INTRAMUSCULAR | Status: AC | PRN
  Administered 2019-08-09: 30 mL via SUBCUTANEOUS
  Filled 2019-08-08: qty 30

## 2019-08-08 MED ORDER — ONDANSETRON HCL 4 MG/2ML IJ SOLN
4.0000 mg | Freq: Four times a day (QID) | INTRAMUSCULAR | Status: DC | PRN
  Administered 2019-08-08: 17:00:00 4 mg via INTRAVENOUS
  Filled 2019-08-08: qty 2

## 2019-08-08 MED ORDER — MISOPROSTOL 25 MCG QUARTER TABLET
25.0000 ug | ORAL_TABLET | ORAL | Status: DC | PRN
  Administered 2019-08-08 (×2): 25 ug via VAGINAL
  Filled 2019-08-08 (×2): qty 1

## 2019-08-08 MED ORDER — PENICILLIN G POT IN DEXTROSE 60000 UNIT/ML IV SOLN
3.0000 10*6.[IU] | INTRAVENOUS | Status: DC
  Administered 2019-08-08 (×4): 3 10*6.[IU] via INTRAVENOUS
  Filled 2019-08-08 (×4): qty 50

## 2019-08-08 MED ORDER — ACETAMINOPHEN 325 MG PO TABS: 650.0000 mg | ORAL_TABLET | ORAL | Status: DC | PRN

## 2019-08-08 MED ORDER — SODIUM CHLORIDE (PF) 0.9 % IJ SOLN
INTRAMUSCULAR | Status: DC | PRN
  Administered 2019-08-08: 12 mL/h via EPIDURAL

## 2019-08-08 MED ORDER — SOD CITRATE-CITRIC ACID 500-334 MG/5ML PO SOLN: 30.0000 mL | ORAL | Status: DC | PRN

## 2019-08-08 MED ORDER — EPHEDRINE 5 MG/ML INJ: 10.0000 mg | INTRAVENOUS | Status: DC | PRN

## 2019-08-08 MED ORDER — DIPHENHYDRAMINE HCL 50 MG/ML IJ SOLN: 12.5000 mg | INTRAMUSCULAR | Status: DC | PRN

## 2019-08-08 MED ORDER — OXYTOCIN 40 UNITS IN NORMAL SALINE INFUSION - SIMPLE MED: 1.0000 m[IU]/min | INTRAVENOUS | Status: DC

## 2019-08-08 MED ORDER — LACTATED RINGERS IV SOLN: 500.0000 mL | Freq: Once | INTRAVENOUS | Status: DC

## 2019-08-08 MED ORDER — LIDOCAINE HCL (PF) 1 % IJ SOLN
INTRAMUSCULAR | Status: DC | PRN
  Administered 2019-08-08 (×2): 4 mL via EPIDURAL

## 2019-08-08 MED ORDER — FENTANYL-BUPIVACAINE-NACL 0.5-0.125-0.9 MG/250ML-% EP SOLN
12.0000 mL/h | EPIDURAL | Status: DC | PRN
  Filled 2019-08-08: qty 250

## 2019-08-08 MED ORDER — FENTANYL CITRATE (PF) 100 MCG/2ML IJ SOLN: 50.0000 ug | INTRAMUSCULAR | Status: DC | PRN

## 2019-08-08 MED ORDER — LACTATED RINGERS IV SOLN: INTRAVENOUS | Status: DC

## 2019-08-08 MED ORDER — OXYTOCIN BOLUS FROM INFUSION
500.0000 mL | Freq: Once | INTRAVENOUS | Status: AC
  Administered 2019-08-08: 500 mL via INTRAVENOUS

## 2019-08-08 MED ORDER — PHENYLEPHRINE 40 MCG/ML (10ML) SYRINGE FOR IV PUSH (FOR BLOOD PRESSURE SUPPORT): 80.0000 ug | PREFILLED_SYRINGE | INTRAVENOUS | Status: DC | PRN

## 2019-08-08 NOTE — Anesthesia Procedure Notes (Signed)
Epidural Patient location during procedure: OB Start time: 08/08/2019 1:15 PM End time: 08/08/2019 1:18 PM  Staffing Anesthesiologist: Kaylyn Layer, MD Performed: anesthesiologist   Preanesthetic Checklist Completed: patient identified, IV checked, risks and benefits discussed, monitors and equipment checked, pre-op evaluation and timeout performed  Epidural Patient position: sitting Prep: DuraPrep and site prepped and draped Patient monitoring: continuous pulse ox, blood pressure and heart rate Approach: midline Location: L3-L4 Injection technique: LOR air  Needle:  Needle type: Tuohy  Needle gauge: 17 G Needle length: 9 cm Needle insertion depth: 7 cm Catheter type: closed end flexible Catheter size: 19 Gauge Catheter at skin depth: 12 cm Test dose: negative and Other (1% lidocaine)  Assessment Events: blood not aspirated, injection not painful, no injection resistance, no paresthesia and negative IV test  Additional Notes Patient identified. Risks, benefits, and alternatives discussed with patient including but not limited to bleeding, infection, nerve damage, paralysis, failed block, incomplete pain control, headache, blood pressure changes, nausea, vomiting, reactions to medication, itching, and postpartum back pain. Confirmed with bedside nurse the patient's most recent platelet count. Confirmed with patient that they are not currently taking any anticoagulation, have any bleeding history, or any family history of bleeding disorders. Patient expressed understanding and wished to proceed. All questions were answered. Sterile technique was used throughout the entire procedure. Please see nursing notes for vital signs.   Crisp LOR after one needle redirection. Test dose was given through epidural catheter and negative prior to continuing to dose epidural or start infusion. Warning signs of high block given to the patient including shortness of breath, tingling/numbness in  hands, complete motor block, or any concerning symptoms with instructions to call for help. Patient was given instructions on fall risk and not to get out of bed. All questions and concerns addressed with instructions to call with any issues or inadequate analgesia.  Reason for block:procedure for pain

## 2019-08-08 NOTE — Progress Notes (Signed)
OBGYN Note Vitals:   08/08/19 1331 08/08/19 1336 08/08/19 1341 08/08/19 1346  BP: (!) 152/93 (!) 142/86 (!) 144/92 (!) 149/87  Pulse: 87 69 73 73  Resp:      Temp:      TempSrc:      Weight:      Height:       FHR 130, Cat 1 Toco q29m SVE 4/70/0. AROM with clear fluid  -Cont pitocin, s/p epidural -CHTN, on no meds, BP mild range Shelley Pruitt 08/08/19 2:06 PM

## 2019-08-08 NOTE — Anesthesia Preprocedure Evaluation (Signed)
Anesthesia Evaluation  Patient identified by MRN, date of birth, ID band Patient awake    Reviewed: Allergy & Precautions, Patient's Chart, lab work & pertinent test results  History of Anesthesia Complications Negative for: history of anesthetic complications  Airway Mallampati: II  TM Distance: >3 FB Neck ROM: Full    Dental no notable dental hx.    Pulmonary neg pulmonary ROS,    Pulmonary exam normal        Cardiovascular hypertension, Normal cardiovascular exam     Neuro/Psych negative neurological ROS  negative psych ROS   GI/Hepatic negative GI ROS, Neg liver ROS,   Endo/Other  Morbid obesity  Renal/GU negative Renal ROS  negative genitourinary   Musculoskeletal negative musculoskeletal ROS (+)   Abdominal   Peds  Hematology negative hematology ROS (+)   Anesthesia Other Findings Day of surgery medications reviewed with patient.  Reproductive/Obstetrics (+) Pregnancy                             Anesthesia Physical Anesthesia Plan  ASA: III  Anesthesia Plan: Epidural   Post-op Pain Management:    Induction:   PONV Risk Score and Plan: Treatment may vary due to age or medical condition  Airway Management Planned: Natural Airway  Additional Equipment:   Intra-op Plan:   Post-operative Plan:   Informed Consent: I have reviewed the patients History and Physical, chart, labs and discussed the procedure including the risks, benefits and alternatives for the proposed anesthesia with the patient or authorized representative who has indicated his/her understanding and acceptance.       Plan Discussed with:   Anesthesia Plan Comments:         Anesthesia Quick Evaluation  

## 2019-08-08 NOTE — H&P (Signed)
Shelley Pruitt is a 29 y.o. female G1P0 [redacted]w[redacted]d presenting for scheduled IOL 2/2 CHTN. She reports no LOF, VB, or contractions. Reports normal FM.  Pregnancy c/b 1. CHTN on no meds 2. Obesity BMI 44  OB History    Gravida  1   Para      Term      Preterm      AB      Living        SAB      TAB      Ectopic      Multiple      Live Births             Past Medical History:  Diagnosis Date  . Benign essential hypertension complicating pregnancy   . Pregnancy induced hypertension    Past Surgical History:  Procedure Laterality Date  . BREAST LUMPECTOMY Right 07/08/2018   Procedure: RIGHT BREAST LUMPECTOMY;  Surgeon: Shelley Miyamoto, MD;  Location: [facility];  Service: General;  Laterality: Right;   Family History: family history is not on file. Social History:  reports that she has never smoked. She has never used smokeless tobacco. She reports that she does not drink alcohol or use drugs.     Maternal Diabetes: No Genetic Screening: Declined Maternal Ultrasounds/Referrals: Normal [place] 07/15/2019 EFW 6lb11oz, 81%, posterior placenta Fetal Ultrasounds or other Referrals:  None Maternal Substance Abuse:  No Significant Maternal Medications:  None Significant Maternal Lab Results:  Group B Strep positive Other Comments:  None  Review of Systems Per HPI Exam Physical Exam  Dilation: 2 Effacement (%): 50 Station: -2 Exam by:: Shelley Pruitt, RNC Blood pressure 138/89, pulse 75, temperature 98 F (36.7 C), temperature source Oral, resp. rate 18, height 5' (1.524 m), weight 103.9 kg, last menstrual period 10/28/2018. NAD, resting comfortably Gravid abdomen Fetal testing: FHR 120, Cat 1, toco irritable  Prenatal labs: ABO, Rh:  --/--/A POS (03/29 0027) Antibody: NEG (03/29 0027) Rubella: Immune (09/22 0000) RPR: Nonreactive (09/22 0000)  HBsAg: Negative (09/22 0000)  HIV: Non-reactive (09/22 0000)  GBS: Positive/-- (09/22 0000)    Assessment/Plan: Shelley Pruitt 29 y.o. G1P0 at [redacted]w[redacted]d here for IOL 2/2 CHTN 1. IOL: On admit 2/50/-2, received cytotec x2, plan for pitocin/AROM prn. GBS+ on PCN 2. CHTN: not currently on meds, monitor Bps 3. Obesity: SCDs  Shelley Pruitt 08/08/2019, 9:19 AM

## 2019-08-09 ENCOUNTER — Encounter (HOSPITAL_COMMUNITY): Payer: Self-pay | Admitting: Obstetrics and Gynecology

## 2019-08-09 LAB — CBC
HCT: 34.8 % — ABNORMAL LOW (ref 36.0–46.0)
Hemoglobin: 11.6 g/dL — ABNORMAL LOW (ref 12.0–15.0)
MCH: 28.6 pg (ref 26.0–34.0)
MCHC: 33.3 g/dL (ref 30.0–36.0)
MCV: 85.7 fL (ref 80.0–100.0)
Platelets: 255 10*3/uL (ref 150–400)
RBC: 4.06 MIL/uL (ref 3.87–5.11)
RDW: 14.9 % (ref 11.5–15.5)
WBC: 20.8 10*3/uL — ABNORMAL HIGH (ref 4.0–10.5)
nRBC: 0 % (ref 0.0–0.2)

## 2019-08-09 MED ORDER — PRENATAL MULTIVITAMIN CH
1.0000 | ORAL_TABLET | Freq: Every day | ORAL | Status: DC
  Administered 2019-08-09: 1 via ORAL
  Filled 2019-08-09: qty 1

## 2019-08-09 MED ORDER — SIMETHICONE 80 MG PO CHEW: 80.0000 mg | CHEWABLE_TABLET | ORAL | Status: DC | PRN

## 2019-08-09 MED ORDER — WITCH HAZEL-GLYCERIN EX PADS: 1.0000 "application " | MEDICATED_PAD | CUTANEOUS | Status: DC | PRN

## 2019-08-09 MED ORDER — DOCUSATE SODIUM 100 MG PO CAPS
100.0000 mg | ORAL_CAPSULE | Freq: Two times a day (BID) | ORAL | Status: DC
  Administered 2019-08-09 – 2019-08-10 (×2): 100 mg via ORAL
  Filled 2019-08-09: qty 1

## 2019-08-09 MED ORDER — SODIUM CHLORIDE 0.9% FLUSH: 3.0000 mL | Freq: Two times a day (BID) | INTRAVENOUS | Status: DC

## 2019-08-09 MED ORDER — IBUPROFEN 600 MG PO TABS
600.0000 mg | ORAL_TABLET | Freq: Four times a day (QID) | ORAL | Status: DC
  Administered 2019-08-09 – 2019-08-10 (×5): 600 mg via ORAL
  Filled 2019-08-09 (×5): qty 1

## 2019-08-09 MED ORDER — SODIUM CHLORIDE 0.9 % IV SOLN: 250.0000 mL | INTRAVENOUS | Status: DC | PRN

## 2019-08-09 MED ORDER — OXYCODONE HCL 5 MG PO TABS: 5.0000 mg | ORAL_TABLET | ORAL | Status: DC | PRN

## 2019-08-09 MED ORDER — DIBUCAINE (PERIANAL) 1 % EX OINT
1.0000 "application " | TOPICAL_OINTMENT | CUTANEOUS | Status: DC | PRN
  Filled 2019-08-09: qty 28

## 2019-08-09 MED ORDER — ONDANSETRON HCL 4 MG/2ML IJ SOLN: 4.0000 mg | INTRAMUSCULAR | Status: DC | PRN

## 2019-08-09 MED ORDER — BENZOCAINE-MENTHOL 20-0.5 % EX AERO
1.0000 "application " | INHALATION_SPRAY | CUTANEOUS | Status: DC | PRN
  Administered 2019-08-09: 1 via TOPICAL
  Filled 2019-08-09 (×2): qty 56

## 2019-08-09 MED ORDER — ONDANSETRON HCL 4 MG PO TABS: 4.0000 mg | ORAL_TABLET | ORAL | Status: DC | PRN

## 2019-08-09 MED ORDER — ACETAMINOPHEN 325 MG PO TABS: 650.0000 mg | ORAL_TABLET | ORAL | Status: DC | PRN

## 2019-08-09 MED ORDER — OXYCODONE HCL 5 MG PO TABS: 10.0000 mg | ORAL_TABLET | ORAL | Status: DC | PRN

## 2019-08-09 MED ORDER — COCONUT OIL OIL: 1.0000 "application " | TOPICAL_OIL | Status: DC | PRN

## 2019-08-09 MED ORDER — DIPHENHYDRAMINE HCL 25 MG PO CAPS: 25.0000 mg | ORAL_CAPSULE | Freq: Four times a day (QID) | ORAL | Status: DC | PRN

## 2019-08-09 MED ORDER — TETANUS-DIPHTH-ACELL PERTUSSIS 5-2.5-18.5 LF-MCG/0.5 IM SUSP: 0.5000 mL | Freq: Once | INTRAMUSCULAR | Status: DC

## 2019-08-09 MED ORDER — SODIUM CHLORIDE 0.9% FLUSH: 3.0000 mL | INTRAVENOUS | Status: DC | PRN

## 2019-08-09 NOTE — Lactation Note (Signed)
This note was copied from a baby's chart. Lactation Consultation Note:  Mother is a P87, infant is 66 hours old, infant has shoulder dystocia.  Mother was given [facility] brochure and basic teaching done.  Mother reports that infant is feeding well.  Mother reports that infant just fed.  Reviewed hand expression with mother. Observed large drops of colostrum. Mother  Mothers nipples are erect with compressible breast tissue.  Mother has a scar on her left areola at 12-2 o'clock , from a large benign growth that was removed in February of 2020. She reports that the growth extended to the top of the breast .   Mother to continue to cue base feed infant and feed at least 8-12 times or more in 24 hours and advised to allow for cluster feeding infant as needed.   Mother to continue to due STS. Mother is aware of available LC services at [facility], BFSG'S, OP Dept, and phone # for questions or concerns about breastfeeding.  Mother receptive to all teaching and plan of care.    Patient Name: Shelley Pruitt SLHTD'S Date: 08/09/2019     Maternal Data    Feeding Feeding Type: Breast Fed  LATCH Score Latch: Repeated attempts needed to sustain latch, nipple held in mouth throughout feeding, stimulation needed to elicit sucking reflex.  Audible Swallowing: A few with stimulation  Type of Nipple: Everted at rest and after stimulation  Comfort (Breast/Nipple): Soft / non-tender  Hold (Positioning): No assistance needed to correctly position infant at breast.  LATCH Score: 8  Interventions    Lactation Tools Discussed/Used     Consult Status      Michel Bickers 08/09/2019, 4:00 PM

## 2019-08-09 NOTE — Progress Notes (Signed)
Patient is eating, ambulating, voiding.  Pain control is good.  Appropriate lochia, no complaints.  Vitals:   08/09/19 0117 08/09/19 0205 08/09/19 0255 08/09/19 0748  BP: 139/82 (!) 142/77 (!) 144/81 135/76  Pulse: 90 86 (!) 110 76  Resp: 16 18 18 20   Temp:  98.3 F (36.8 C) 98.2 F (36.8 C) 98.3 F (36.8 C)  TempSrc:  Oral Oral Oral  Weight:      Height:        Fundus firm Abd: soft nontender Ext: +2 edema, no calf tenderenss  Lab Results  Component Value Date   WBC 20.8 (H) 08/09/2019   HGB 11.6 (L) 08/09/2019   HCT 34.8 (L) 08/09/2019   MCV 85.7 08/09/2019   PLT 255 08/09/2019    --/--/A POS (03/29 0027)  A/P Post partum day 1. CHTN- mild and normal BPs, one severe range on admission, labs normal Shoulder dystocia- baby doing well  Routine care.  Expect d/c 3/31.    4/31

## 2019-08-09 NOTE — Progress Notes (Signed)
CSW received consult for history of anxiety. CSW met with MOB to offer support and complete assessment.    MOB sitting up in bed breastfeeding infant, when CSW entered the room. CSW introduced self and explained reason for consult to which MOB expressed understanding. MOB welcoming of Rosine visit and was very pleasant and engaged throughout assessment. 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 MOB stated she keeps a good eye on it and has good communication with her doctor. MOB denied any current medications or counseling, or need for either at this time. CSW provided education regarding the baby blues period vs. perinatal mood disorders, discussed treatment and gave resources for mental health follow up if concerns arise. CSW recommended self-evaluation during the postpartum time period using the New Mom Checklist from Postpartum Progress and encouraged MOB to contact a medical professional if symptoms are noted at any time. MOB did not appear to be displaying any acute mental health symptoms and denied any current SI, HI or DV. MOB reported having a good support system consisting of FOB, her parents and her in-laws.   MOB reported having all essential items for infant once discharged and stated infant would be sleeping in a bassinet once home. CSW provided review of Sudden Infant Death Syndrome (SIDS) precautions and safe sleeping habits.    CSW identifies no further need for intervention and no barriers to discharge at this time.  Elijio Miles, LCSW [facility] [employer] [phone]

## 2019-08-09 NOTE — Anesthesia Postprocedure Evaluation (Signed)
Anesthesia Post Note  Patient: Shelley Pruitt  Procedure(s) Performed: AN AD HOC LABOR EPIDURAL     Patient location during evaluation: Mother Baby Anesthesia Type: Epidural Level of consciousness: awake Pain management: satisfactory to patient Vital Signs Assessment: post-procedure vital signs reviewed and stable Respiratory status: spontaneous breathing Cardiovascular status: stable Anesthetic complications: no    Last Vitals:  Vitals:   08/09/19 0205 08/09/19 0255  BP: (!) 142/77 (!) 144/81  Pulse: 86 (!) 110  Resp: 18 18  Temp: 36.8 C 36.8 C    Last Pain:  Vitals:   08/09/19 0440  TempSrc:   PainSc: 3    Pain Goal:                   [employer]

## 2019-08-09 NOTE — Plan of Care (Signed)
  Problem: Education: Goal: Knowledge of General Education information will improve Description: Including pain rating scale, medication(s)/side effects and non-pharmacologic comfort measures Outcome: Completed/Met   Problem: Pain Managment: Goal: General experience of comfort will improve Outcome: Completed/Met   Problem: Role Relationship: Goal: Ability to demonstrate positive interaction with newborn will improve Outcome: Completed/Met

## 2019-08-09 NOTE — Plan of Care (Signed)
  Problem: Education: Goal: Knowledge of General Education information will improve Description: Including pain rating scale, medication(s)/side effects and non-pharmacologic comfort measures Outcome: Completed/Met   Problem: Pain Managment: Goal: General experience of comfort will improve Outcome: Completed/Met   

## 2019-08-10 NOTE — Discharge Summary (Signed)
Obstetric Discharge Summary Reason for Admission: induction of labor Prenatal Procedures: NST and ultrasound Intrapartum Procedures: spontaneous vaginal delivery Postpartum Procedures: none Complications-Operative and Postpartum: shoulder dystocia Hemoglobin  Date Value Ref Range Status  08/09/2019 11.6 (L) 12.0 - 15.0 g/dL Final   HCT  Date Value Ref Range Status  08/09/2019 34.8 (L) 36.0 - 46.0 % Final    Physical Exam:  General: alert and cooperative Lochia: appropriate   Discharge Diagnoses: Term Pregnancy-delivered and shoulder dystocia and chronic hypertension  Discharge Information: Date: 08/10/2019 Activity: pelvic rest Diet: routine Medications: PNV and Ibuprofen Condition: stable Instructions: refer to practice specific booklet Discharge to: home Follow-up Information    Levi Aland, MD. Schedule an appointment as soon as possible for a visit.   Specialty: Obstetrics and Gynecology Contact information: [address] [place] [address] [phone]           Newborn Data: Live born female  Birth Weight: 8 lb 3.6 oz (3731 g) APGAR: 7, 9  Newborn Delivery   Birth date/time: 08/08/2019 23:32:00 Delivery type: Vaginal, Spontaneous      Home with mother.  Addison Freimuth E 08/10/2019, 10:18 AM

## 2019-08-10 NOTE — ED Provider Notes (Signed)
PPD#2 Pt has no complaints. Strongly desires to go home. She has a b/p cuff at home and agrees to check B/P QID. Lochia -light VS- rare slightly elevated b/p Imp/ Stable Plan/ Will discharge to home and check B/P Qid. Will call if > 140/90. Will return to office in 2 days for B/P check

## 2019-08-10 NOTE — Lactation Note (Addendum)
This note was copied from a baby's chart. Lactation Consultation Note; Mother reports that infant has been cluster feeding .  Assist mother with latching infant on in cross cradle hold. Infant latched with better depth. Observed latch and taught mother to look for good milk transfer.  Mother getting very tired from all night of cluster feeding. Advised mother to  Continue to nap frequently.   Discussed treatment and prevention of engorgement.   Mother to continue to cue base feed infant and feed at least 8-12 times or more in 24 hours and advised to allow for cluster feeding infant as needed.    Mother to continue to due STS. Mother is aware of available LC services at [facility], BFSG'S, OP Dept, and phone # for questions or concerns about breastfeeding.  Mother receptive to all teaching and plan of care.    Patient Name: Shelley Pruitt SWNIO'E Date: 08/10/2019 Reason for consult: Follow-up assessment   Maternal Data    Feeding Feeding Type: Breast Fed  LATCH Score                   Interventions    Lactation Tools Discussed/Used     Consult Status      Michel Bickers 08/10/2019, 11:13 AM

## 2020-10-19 IMAGING — US ULTRASOUND RIGHT BREAST LIMITED
1 series · 12 of 12 positions shown · non-contrast
Comparison: Previous exam(s).

CLINICAL DATA: Follow-up of right breast mass. Per patient are
breast mass is enlarging.

EXAM:
ULTRASOUND OF THE RIGHT BREAST

[Series 1: ultrasound right breast limited · 0.10mm/px · 12 of 12 slices shown]
[im 1/12]
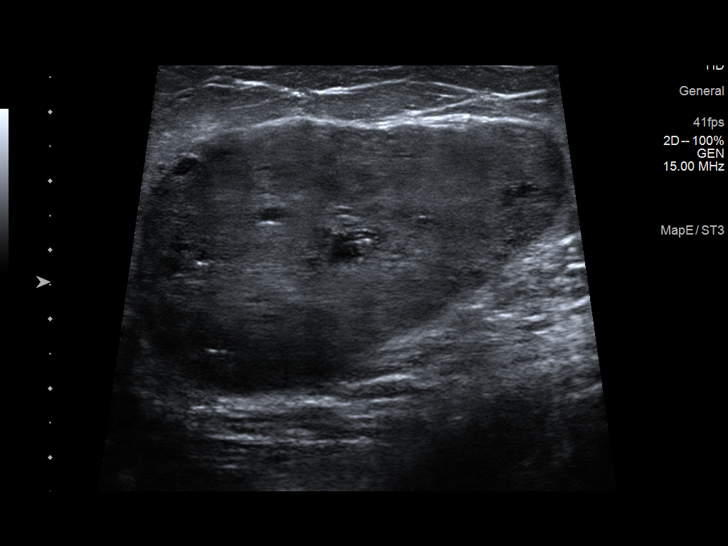
[im 2/12]
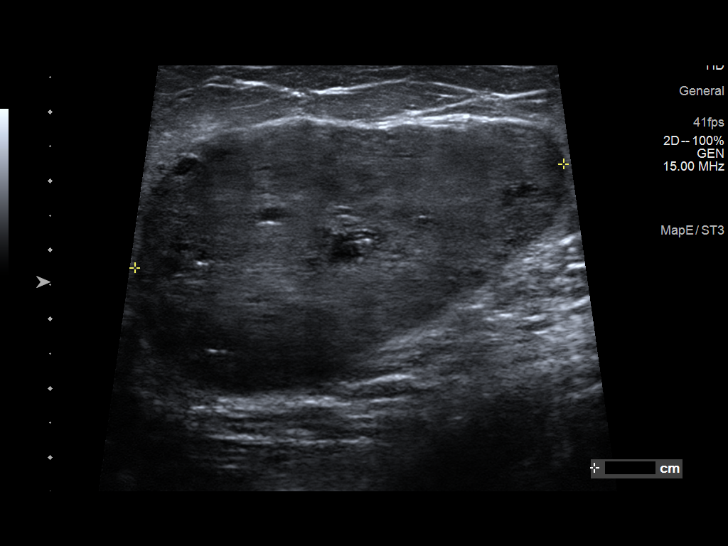
[im 3/12]
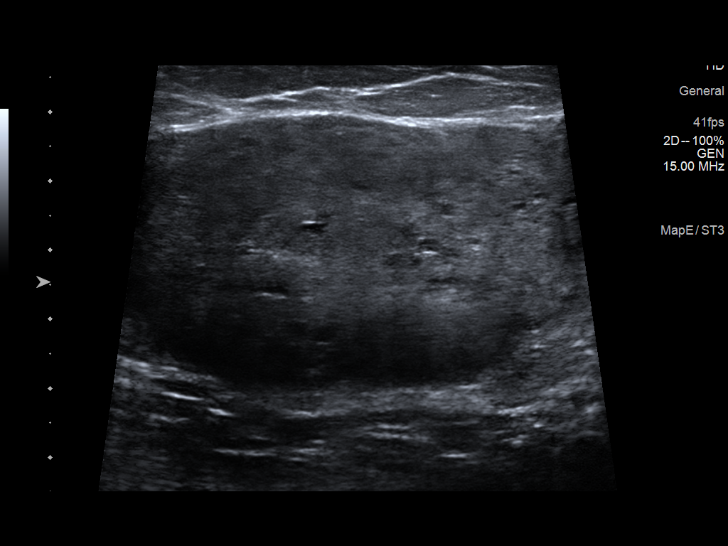
[im 4/12]
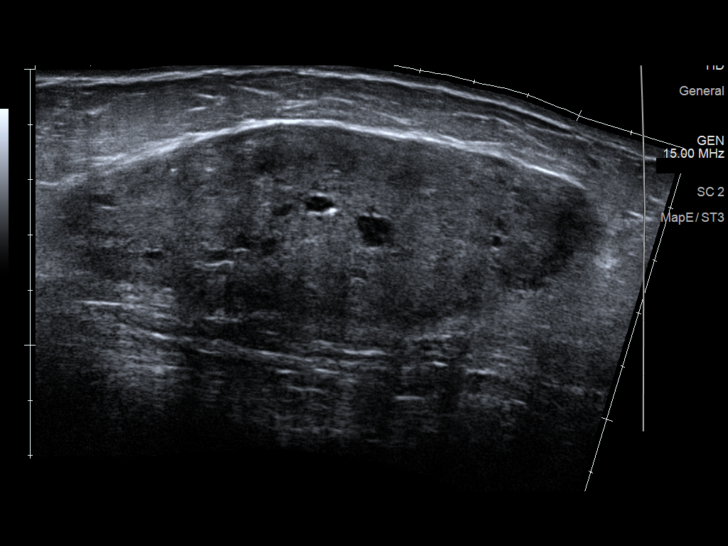
[im 5/12]
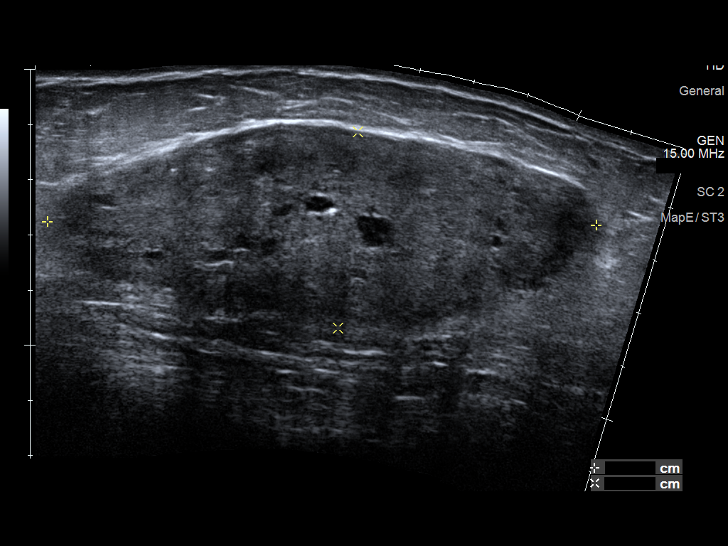
[im 6/12]
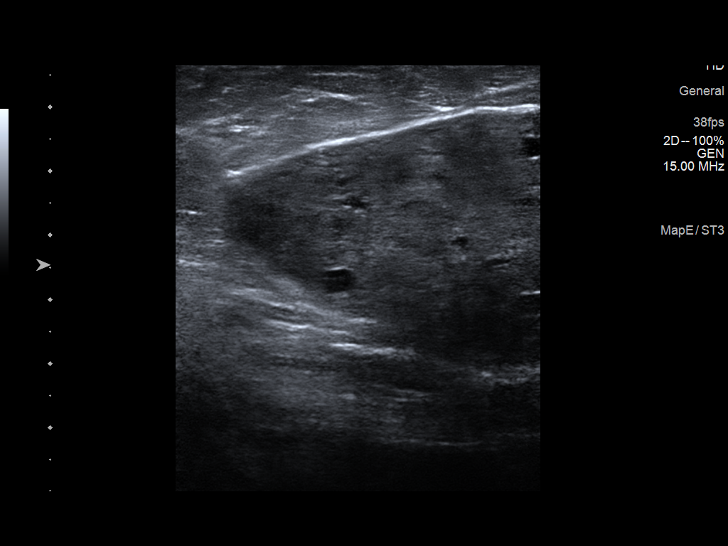
[im 7/12]
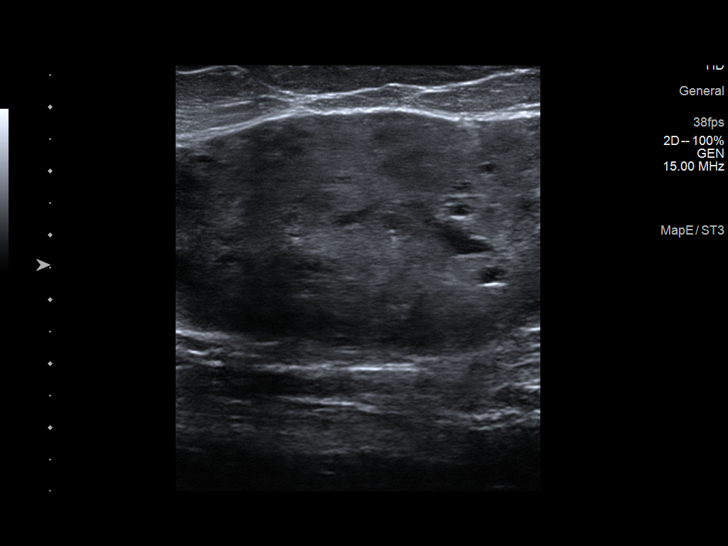
[im 8/12]
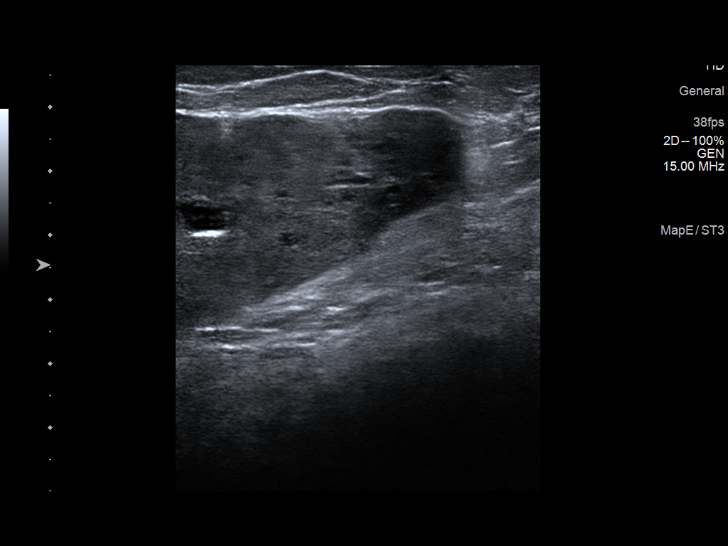
[im 9/12]
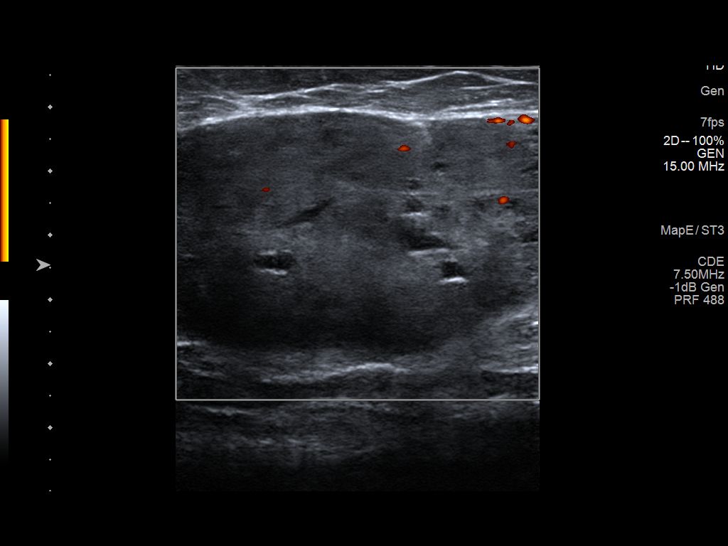
[im 10/12]
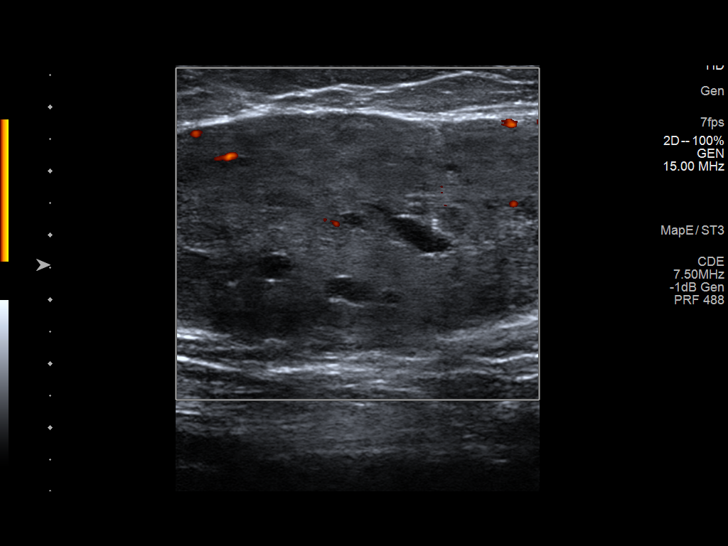
[im 11/12]
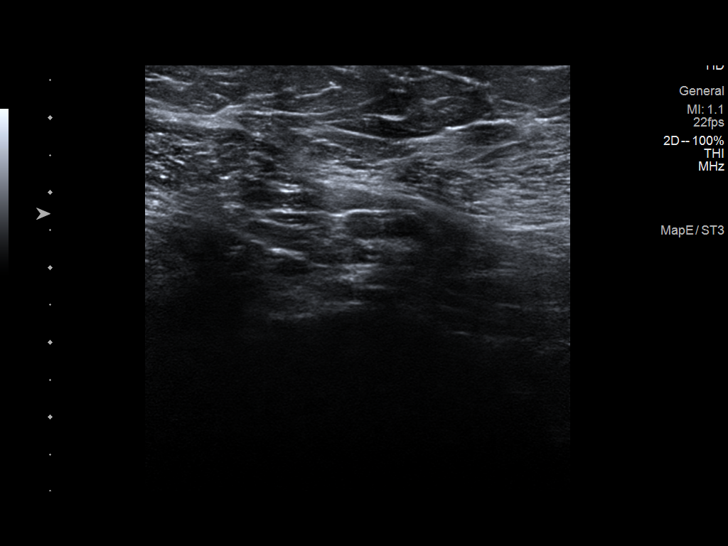
[im 12/12]
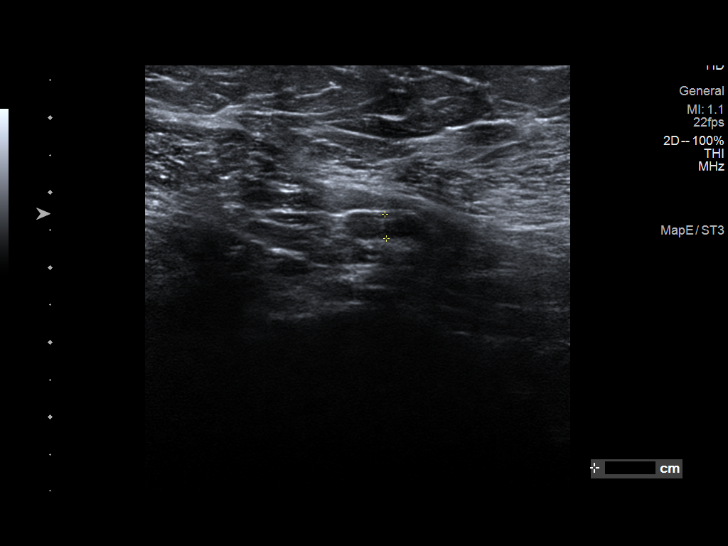

[12 of 12 positions shown; findings below may reference images not displayed]

FINDINGS: On physical exam, there is a freely mobile large circumscribed
moderately firm mass in the upper right breast.

Targeted ultrasound is performed, showing right breast 1 o'clock 7
cm from the nipple hypoechoic circumscribed mass which measures
by 9.9 by 3.6 cm. Cystic spaces are seen within this mass. For
reference, the mass measured 5.4 by 8.0 by 3.0 cm in December 2016.

There is no evidence of right axillary lymphadenopathy.
IMPRESSION: Enlarging 9.9 cm right breast mass, for which ultrasound-guided core
needle biopsy is recommended.

RECOMMENDATION:
Ultrasound-guided core needle biopsy of the right breast 1 o'clock
mass.

I have discussed the findings and recommendations with the patient.
Results were also provided in writing at the conclusion of the
visit. If applicable, a reminder letter will be sent to the patient
regarding the next appointment.

BI-RADS CATEGORY  4: Suspicious.

## 2021-10-15 LAB — OB RESULTS CONSOLE GC/CHLAMYDIA
Chlamydia: NEGATIVE
Neisseria Gonorrhea: NEGATIVE

## 2021-11-13 LAB — OB RESULTS CONSOLE RPR: RPR: NONREACTIVE

## 2021-11-13 LAB — OB RESULTS CONSOLE HEPATITIS B SURFACE ANTIGEN: Hepatitis B Surface Ag: NEGATIVE

## 2021-11-13 LAB — OB RESULTS CONSOLE RUBELLA ANTIBODY, IGM: Rubella: IMMUNE

## 2021-11-13 LAB — HEPATITIS C ANTIBODY: HCV Ab: NEGATIVE

## 2021-11-13 LAB — OB RESULTS CONSOLE HIV ANTIBODY (ROUTINE TESTING): HIV: NONREACTIVE

## 2021-11-13 LAB — OB RESULTS CONSOLE ANTIBODY SCREEN: Antibody Screen: NEGATIVE

## 2022-04-30 LAB — OB RESULTS CONSOLE GBS: GBS: POSITIVE

## 2022-05-12 NOTE — L&D Delivery Note (Signed)
Delivery Note Patient entered active labor and progressed rapidly to 10/100/-1.  Due to significant pressure, pushing was initiated.  She pushed well for < 5 minutes.  At 5:32 PM a viable female was delivered via  (Presentation:   direct OA   ).    Placenta status: Spontaneous, in tact.  Cord: 3V   with the following complications: None .  Cord pH:   Following delivery of the head, the left anterior shoulder did not easily deliver.  Shoulder dystocia was identified and additional assistance was requested.  Patient was instructed to stop pushing.  Was placed in a completely supine position.  McRoberts and suprapubic pressure did not relieve the dystocia.  The right anterior axilla was grasped in a shoulder shrug and with initial rotation, the left anterior shoulder rotated free from under the pubic bone and delivery of the body quickly occurred.  The infant was taken to the warmer for warm-dry stimulation and BBO2.  She was moving all upper extremities.  Following repair, was skin to skin with mom in the delivery room   Delivery of the head: 1731  First maneuver: McRoberts,   Second maneuver: Suprapubic pressure 1732 Third maneuver: Shoulder shrug 1732,   Fourth maneuver: ,   Fifth maneuver: ,   Sixth maneuver: ,    Verbal consent: obtained from patient.  APGAR: 6, 9; weight 4010gm (8lb 13.5oz) .   Marland Kitchen   Placenta status:  Spontaneous, in tact.  Cord: 3V   with the following complications: None  Cord pH: 7.22  pCO2 56  Anesthesia:  Epidural Episiotomy:  None Lacerations: 1st degree;Perineal;Vaginal Suture Repair: 3.0 vicryl rapide Est. Blood Loss (mL): 76  Mom to postpartum.  Baby to Couplet care / Skin to Skin.  [place] 05/21/2022, 5:58 PM  Late addendum:  Of note, when repairing the first degree perineal laceration, there was a small sub-centimeter suture granuloma at the edge of the laceration.  With patient permission, this was excised with mayo scissors prior to closing the  laceration.

## 2022-05-15 ENCOUNTER — Encounter (HOSPITAL_COMMUNITY): Payer: Self-pay | Admitting: *Deleted

## 2022-05-15 ENCOUNTER — Telehealth (HOSPITAL_COMMUNITY): Payer: Self-pay | Admitting: *Deleted

## 2022-05-15 NOTE — Telephone Encounter (Signed)
Preadmission screen  

## 2022-05-21 ENCOUNTER — Inpatient Hospital Stay (HOSPITAL_COMMUNITY)
Admission: RE | Admit: 2022-05-21 | Discharge: 2022-05-22 | DRG: 807 | Disposition: A | Discharge status: Home / Self Care | Payer: No Typology Code available for payment source | Attending: Obstetrics | Admitting: Obstetrics

## 2022-05-21 ENCOUNTER — Inpatient Hospital Stay (HOSPITAL_COMMUNITY): Payer: No Typology Code available for payment source

## 2022-05-21 ENCOUNTER — Other Ambulatory Visit: Payer: Self-pay

## 2022-05-21 ENCOUNTER — Encounter (HOSPITAL_COMMUNITY): Payer: Self-pay | Admitting: Obstetrics

## 2022-05-21 ENCOUNTER — Inpatient Hospital Stay (HOSPITAL_COMMUNITY): Payer: No Typology Code available for payment source | Admitting: Anesthesiology

## 2022-05-21 DIAGNOSIS — Z3A39 39 weeks gestation of pregnancy: Secondary | ICD-10-CM | POA: Diagnosis not present

## 2022-05-21 DIAGNOSIS — I10 Essential (primary) hypertension: Secondary | ICD-10-CM

## 2022-05-21 DIAGNOSIS — O1002 Pre-existing essential hypertension complicating childbirth: Secondary | ICD-10-CM | POA: Diagnosis present

## 2022-05-21 DIAGNOSIS — Z23 Encounter for immunization: Secondary | ICD-10-CM

## 2022-05-21 DIAGNOSIS — O99214 Obesity complicating childbirth: Secondary | ICD-10-CM | POA: Diagnosis present

## 2022-05-21 DIAGNOSIS — O99824 Streptococcus B carrier state complicating childbirth: Secondary | ICD-10-CM | POA: Diagnosis present

## 2022-05-21 DIAGNOSIS — O10013 Pre-existing essential hypertension complicating pregnancy, third trimester: Principal | ICD-10-CM

## 2022-05-21 LAB — CBC
HCT: 38.1 % (ref 36.0–46.0)
HCT: 42.9 % (ref 36.0–46.0)
Hemoglobin: 13 g/dL (ref 12.0–15.0)
Hemoglobin: 14 g/dL (ref 12.0–15.0)
MCH: 28.2 pg (ref 26.0–34.0)
MCH: 27.7 pg (ref 26.0–34.0)
MCHC: 34.1 g/dL (ref 30.0–36.0)
MCHC: 32.6 g/dL (ref 30.0–36.0)
MCV: 82.6 fL (ref 80.0–100.0)
MCV: 84.8 fL (ref 80.0–100.0)
Platelets: 267 10*3/uL (ref 150–400)
Platelets: 272 10*3/uL (ref 150–400)
RBC: 4.61 MIL/uL (ref 3.87–5.11)
RBC: 5.06 MIL/uL (ref 3.87–5.11)
RDW: 15.1 % (ref 11.5–15.5)
RDW: 15.3 % (ref 11.5–15.5)
WBC: 10.2 10*3/uL (ref 4.0–10.5)
WBC: 19.6 10*3/uL — ABNORMAL HIGH (ref 4.0–10.5)
nRBC: 0 % (ref 0.0–0.2)
nRBC: 0 % (ref 0.0–0.2)

## 2022-05-21 LAB — COMPREHENSIVE METABOLIC PANEL
ALT: 16 U/L (ref 0–44)
AST: 23 U/L (ref 15–41)
Albumin: 2.6 g/dL — ABNORMAL LOW (ref 3.5–5.0)
Alkaline Phosphatase: 94 U/L (ref 38–126)
Anion gap: 10 (ref 5–15)
BUN: 6 mg/dL (ref 6–20)
CO2: 20 mmol/L — ABNORMAL LOW (ref 22–32)
Calcium: 8.7 mg/dL — ABNORMAL LOW (ref 8.9–10.3)
Chloride: 105 mmol/L (ref 98–111)
Creatinine, Ser: 0.64 mg/dL (ref 0.44–1.00)
GFR, Estimated: >60 mL/min (ref >60)
Glucose, Bld: 86 mg/dL (ref 70–99)
Potassium: 3.6 mmol/L (ref 3.5–5.1)
Sodium: 135 mmol/L (ref 135–145)
Total Bilirubin: 0.5 mg/dL (ref 0.3–1.2)
Total Protein: 6.1 g/dL — ABNORMAL LOW (ref 6.5–8.1)

## 2022-05-21 LAB — TYPE AND SCREEN
ABO/RH(D): A POS
Antibody Screen: NEGATIVE

## 2022-05-21 LAB — RPR: RPR Ser Ql: NONREACTIVE

## 2022-05-21 MED ORDER — OXYCODONE HCL 5 MG PO TABS: 5.0000 mg | ORAL_TABLET | ORAL | Status: DC | PRN

## 2022-05-21 MED ORDER — LACTATED RINGERS IV SOLN: 500.0000 mL | INTRAVENOUS | Status: DC | PRN

## 2022-05-21 MED ORDER — OXYCODONE-ACETAMINOPHEN 5-325 MG PO TABS: 2.0000 | ORAL_TABLET | ORAL | Status: DC | PRN

## 2022-05-21 MED ORDER — PHENYLEPHRINE 80 MCG/ML (10ML) SYRINGE FOR IV PUSH (FOR BLOOD PRESSURE SUPPORT): 80.0000 ug | PREFILLED_SYRINGE | INTRAVENOUS | Status: DC | PRN

## 2022-05-21 MED ORDER — ACETAMINOPHEN 325 MG PO TABS: 650.0000 mg | ORAL_TABLET | ORAL | Status: DC | PRN

## 2022-05-21 MED ORDER — DIBUCAINE (PERIANAL) 1 % EX OINT: 1.0000 | TOPICAL_OINTMENT | CUTANEOUS | Status: DC | PRN

## 2022-05-21 MED ORDER — INFLUENZA VAC SPLIT QUAD 0.5 ML IM SUSY
0.5000 mL | PREFILLED_SYRINGE | INTRAMUSCULAR | Status: AC
  Administered 2022-05-22: 0.5 mL via INTRAMUSCULAR
  Filled 2022-05-21: qty 0.5

## 2022-05-21 MED ORDER — LACTATED RINGERS IV SOLN
500.0000 mL | Freq: Once | INTRAVENOUS | Status: AC
  Administered 2022-05-21: 500 mL via INTRAVENOUS

## 2022-05-21 MED ORDER — LIDOCAINE HCL (PF) 1 % IJ SOLN
INTRAMUSCULAR | Status: DC | PRN
  Administered 2022-05-21: 5 mL via EPIDURAL
  Administered 2022-05-21: 4 mL via EPIDURAL

## 2022-05-21 MED ORDER — SODIUM CHLORIDE 0.9 % IV SOLN
5.0000 10*6.[IU] | Freq: Once | INTRAVENOUS | Status: AC
  Administered 2022-05-21: 5 10*6.[IU] via INTRAVENOUS
  Filled 2022-05-21: qty 5

## 2022-05-21 MED ORDER — ONDANSETRON HCL 4 MG PO TABS: 4.0000 mg | ORAL_TABLET | ORAL | Status: DC | PRN

## 2022-05-21 MED ORDER — PHENYLEPHRINE 80 MCG/ML (10ML) SYRINGE FOR IV PUSH (FOR BLOOD PRESSURE SUPPORT)
80.0000 ug | PREFILLED_SYRINGE | INTRAVENOUS | Status: DC | PRN
  Filled 2022-05-21: qty 10

## 2022-05-21 MED ORDER — EPHEDRINE 5 MG/ML INJ: 10.0000 mg | INTRAVENOUS | Status: DC | PRN

## 2022-05-21 MED ORDER — PENICILLIN G POT IN DEXTROSE 60000 UNIT/ML IV SOLN
3.0000 10*6.[IU] | INTRAVENOUS | Status: DC
  Administered 2022-05-21 (×2): 3 10*6.[IU] via INTRAVENOUS
  Filled 2022-05-21 (×2): qty 50

## 2022-05-21 MED ORDER — LACTATED RINGERS IV SOLN: INTRAVENOUS | Status: DC

## 2022-05-21 MED ORDER — ONDANSETRON HCL 4 MG/2ML IJ SOLN: 4.0000 mg | INTRAMUSCULAR | Status: DC | PRN

## 2022-05-21 MED ORDER — LIDOCAINE-EPINEPHRINE (PF) 2 %-1:200000 IJ SOLN
INTRAMUSCULAR | Status: DC | PRN
  Administered 2022-05-21: 5 mL via EPIDURAL

## 2022-05-21 MED ORDER — ONDANSETRON HCL 4 MG/2ML IJ SOLN
4.0000 mg | Freq: Four times a day (QID) | INTRAMUSCULAR | Status: DC | PRN
  Administered 2022-05-21: 4 mg via INTRAVENOUS
  Filled 2022-05-21: qty 2

## 2022-05-21 MED ORDER — OXYTOCIN-SODIUM CHLORIDE 30-0.9 UT/500ML-% IV SOLN: 2.5000 [IU]/h | INTRAVENOUS | Status: DC

## 2022-05-21 MED ORDER — SIMETHICONE 80 MG PO CHEW: 80.0000 mg | CHEWABLE_TABLET | ORAL | Status: DC | PRN

## 2022-05-21 MED ORDER — DIPHENHYDRAMINE HCL 50 MG/ML IJ SOLN: 12.5000 mg | INTRAMUSCULAR | Status: DC | PRN

## 2022-05-21 MED ORDER — PRENATAL MULTIVITAMIN CH
1.0000 | ORAL_TABLET | Freq: Every day | ORAL | Status: DC
  Administered 2022-05-22: 1 via ORAL
  Filled 2022-05-21: qty 1

## 2022-05-21 MED ORDER — SENNOSIDES-DOCUSATE SODIUM 8.6-50 MG PO TABS: 2.0000 | ORAL_TABLET | ORAL | Status: DC

## 2022-05-21 MED ORDER — WITCH HAZEL-GLYCERIN EX PADS: 1.0000 | MEDICATED_PAD | CUTANEOUS | Status: DC | PRN

## 2022-05-21 MED ORDER — FENTANYL-BUPIVACAINE-NACL 0.5-0.125-0.9 MG/250ML-% EP SOLN
12.0000 mL/h | EPIDURAL | Status: DC | PRN
  Administered 2022-05-21: 12 mL/h via EPIDURAL
  Filled 2022-05-21: qty 250

## 2022-05-21 MED ORDER — OXYCODONE-ACETAMINOPHEN 5-325 MG PO TABS: 1.0000 | ORAL_TABLET | ORAL | Status: DC | PRN

## 2022-05-21 MED ORDER — SOD CITRATE-CITRIC ACID 500-334 MG/5ML PO SOLN: 30.0000 mL | ORAL | Status: DC | PRN

## 2022-05-21 MED ORDER — OXYTOCIN BOLUS FROM INFUSION
333.0000 mL | Freq: Once | INTRAVENOUS | Status: AC
  Administered 2022-05-21: 333 mL via INTRAVENOUS

## 2022-05-21 MED ORDER — BENZOCAINE-MENTHOL 20-0.5 % EX AERO
1.0000 | INHALATION_SPRAY | CUTANEOUS | Status: DC | PRN
  Administered 2022-05-21: 1 via TOPICAL
  Filled 2022-05-21: qty 56

## 2022-05-21 MED ORDER — IBUPROFEN 600 MG PO TABS
600.0000 mg | ORAL_TABLET | Freq: Four times a day (QID) | ORAL | Status: DC
  Administered 2022-05-21 – 2022-05-22 (×4): 600 mg via ORAL
  Filled 2022-05-21 (×4): qty 1

## 2022-05-21 MED ORDER — FENTANYL CITRATE (PF) 100 MCG/2ML IJ SOLN: 50.0000 ug | INTRAMUSCULAR | Status: DC | PRN

## 2022-05-21 MED ORDER — COCONUT OIL OIL: 1.0000 | TOPICAL_OIL | Status: DC | PRN

## 2022-05-21 MED ORDER — DIPHENHYDRAMINE HCL 25 MG PO CAPS: 25.0000 mg | ORAL_CAPSULE | Freq: Four times a day (QID) | ORAL | Status: DC | PRN

## 2022-05-21 MED ORDER — OXYTOCIN-SODIUM CHLORIDE 30-0.9 UT/500ML-% IV SOLN
1.0000 m[IU]/min | INTRAVENOUS | Status: DC
  Administered 2022-05-21: 2 m[IU]/min via INTRAVENOUS
  Filled 2022-05-21: qty 500

## 2022-05-21 MED ORDER — LIDOCAINE HCL (PF) 1 % IJ SOLN: 30.0000 mL | INTRAMUSCULAR | Status: DC | PRN

## 2022-05-21 MED ORDER — TETANUS-DIPHTH-ACELL PERTUSSIS 5-2.5-18.5 LF-MCG/0.5 IM SUSY: 0.5000 mL | PREFILLED_SYRINGE | Freq: Once | INTRAMUSCULAR | Status: DC

## 2022-05-21 MED ORDER — OXYCODONE HCL 5 MG PO TABS: 10.0000 mg | ORAL_TABLET | ORAL | Status: DC | PRN

## 2022-05-21 MED ORDER — TERBUTALINE SULFATE 1 MG/ML IJ SOLN: 0.2500 mg | Freq: Once | INTRAMUSCULAR | Status: DC | PRN

## 2022-05-21 NOTE — H&P (Signed)
32 y.o. G2P1001 @ [redacted]w[redacted]d presents for  induction of labor for chronic hypertension not on medication.  Otherwise has good fetal movement and no bleeding.  Pregnancy complicated by: Chronic hypertension, well controlled on no medication.  Growth [place] on 11/22 at 32 weeks EFW 4lb 10oz (58%) History of shoulder dystocia with G1--< 60 seconds, 8lb 3oz, no fetal deficits / injuries.  Discussed option for primary cesarean section and she desires induction of labor.  This fetus is appropriately grown and current leopolds is similar weight  Past Medical History:  Diagnosis Date   Benign essential hypertension complicating pregnancy    Pregnancy induced hypertension     Past Surgical History:  Procedure Laterality Date   BREAST LUMPECTOMY Right 07/08/2018   Procedure: RIGHT BREAST LUMPECTOMY;  Surgeon: Coralie Keens, MD;  Location: [facility];  Service: General;  Laterality: Right;    OB History  Gravida Para Term Preterm AB Living  2 1 1     1   SAB IAB Ectopic Multiple Live Births        0 1    # Outcome Date GA Lbr Len/2nd Weight Sex Delivery Anes PTL Lv  2 Current           1 Term 08/08/19 [redacted]w[redacted]d 05:45 / 03:44 3731 g F Vag-Spont EPI  LIV    Social History   Socioeconomic History   Marital status: Married    Spouse name: Mining engineer   Number of children: Not on file   Years of education: Not on file   Highest education level: Not on file  Occupational History   Not on file  Tobacco Use   Smoking status: Never   Smokeless tobacco: Never  Vaping Use   Vaping Use: Never used  Substance and Sexual Activity   Alcohol use: Never   Drug use: No   Sexual activity: Yes    Birth control/protection: Pill, None  Other Topics Concern   Not on file  Social History Narrative   Not on file   Social Determinants of Health   Financial Resource Strain: Not on file  Food Insecurity: No Food Insecurity (05/21/2022)   Hunger Vital Sign    Worried About Running Out of Food in the  Last Year: Never true    Ran Out of Food in the Last Year: Never true  Transportation Needs: No Transportation Needs (05/21/2022)   PRAPARE - Hydrologist (Medical): No    Lack of Transportation (Non-Medical): No  Physical Activity: Not on file  Stress: Not on file  Social Connections: Not on file  Intimate Partner Violence: Not At Risk (05/21/2022)   Humiliation, Afraid, Rape, and Kick questionnaire    Fear of Current or Ex-Partner: No    Emotionally Abused: No    Physically Abused: No    Sexually Abused: No   Patient has no known allergies.    Prenatal Transfer Tool  Maternal Diabetes: No Genetic Screening: Normal Maternal Ultrasounds/Referrals: Normal Fetal Ultrasounds or other Referrals:  None Maternal Substance Abuse:  No Significant Maternal Medications:  None Significant Maternal Lab Results: Group B Strep positive  ABO, Rh:  A+ Antibody: Negative (07/05 0000) Rubella: Immune (07/05 0000) RPR: Nonreactive (07/05 0000)  HBsAg: Negative (07/05 0000)  HIV: Non-reactive (07/05 0000)  GBS: Positive/-- (12/20 0000)     Vitals:   05/21/22 0749  BP: (!) 149/82  Pulse: 90  Resp: 16     General:  NAD Abdomen:  soft, gravid,  EFW 8-8.5# Ex:  trace edema SVE:  8/37/-2/[id number]/CEYE / cephalic FHTs:  233K, moderate variability, + accelerations, category 1 Toco:  quiet   A/P   32 y.o. G2P1001 [redacted]w[redacted]d presents for induction of labor for chronic hypertension at term IOL:  cervix is favorable, will start pitocin.  AROM when able Epidural upon request History of shoulder dystocia.  Will follow labor curve closely GBS positive--pcn   [place]

## 2022-05-21 NOTE — Anesthesia Procedure Notes (Signed)
Epidural Patient location during procedure: OB Start time: 05/21/2022 11:34 AM End time: 05/21/2022 11:37 AM  Staffing Anesthesiologist: Audry Pili, MD Performed: anesthesiologist   Preanesthetic Checklist Completed: patient identified, IV checked, risks and benefits discussed, monitors and equipment checked, pre-op evaluation and timeout performed  Epidural Patient position: sitting Prep: DuraPrep Patient monitoring: continuous pulse ox and blood pressure Approach: midline Location: L2-L3 Injection technique: LOR saline  Needle:  Needle type: Tuohy  Needle gauge: 17 G Needle length: 9 cm Needle insertion depth: 7 cm Catheter size: 19 Gauge Catheter at skin depth: 12 cm Test dose: negative and Other (1% lidocaine)  Assessment Events: blood not aspirated and no cerebrospinal fluid  Additional Notes Patient identified. Risks including, but not limited to, bleeding, infection, nerve damage, paralysis, inadequate analgesia, blood pressure changes, nausea, vomiting, allergic reaction, postpartum back pain, itching, and headache were discussed. Patient expressed understanding and wished to proceed. Sterile prep and drape, including hand hygiene, mask, and sterile gloves were used. The patient was positioned and the spine was prepped. The skin was anesthetized with lidocaine. No paraesthesia or other complication noted. The patient did not experience any signs of intravascular injection such as tinnitus or metallic taste in mouth, nor signs of intrathecal spread such as rapid motor block. Please see nursing notes for vital signs. The patient tolerated the procedure well.   Renold Don, MDReason for block:procedure for pain

## 2022-05-21 NOTE — Progress Notes (Signed)
Increasing contraction pain  BP 122/80   Pulse 88   Temp 98.1 F (36.7 C) (Oral)   Resp 16   Ht 5\' 3"  (1.6 m)   Wt 101.1 kg   BMI 39.47 kg/m   Toco: q3-4 minutes EFM: 130s moderate variability, category 1 SVE: 4/50/-2, AROM clear fluid  A/P: G2P1 @ [redacted]w[redacted]d with IOL for CHTN at term IOL: progressing appropriately.  Anticipate SVD CHTN:  BPs mild range GBS positive on penicillin

## 2022-05-21 NOTE — Anesthesia Preprocedure Evaluation (Signed)
Anesthesia Evaluation  Patient identified by MRN, date of birth, ID band Patient awake    Reviewed: Allergy & Precautions, NPO status , Patient's Chart, lab work & pertinent test results  History of Anesthesia Complications Negative for: history of anesthetic complications  Airway Mallampati: II   Neck ROM: Full    Dental   Pulmonary neg pulmonary ROS   Pulmonary exam normal        Cardiovascular hypertension, Pt. on medications Normal cardiovascular exam     Neuro/Psych negative neurological ROS  negative psych ROS   GI/Hepatic negative GI ROS, Neg liver ROS,,,  Endo/Other   Obesity  Renal/GU negative Renal ROS     Musculoskeletal negative musculoskeletal ROS (+)    Abdominal   Peds  Hematology negative hematology ROS (+)   Anesthesia Other Findings   Reproductive/Obstetrics (+) Pregnancy                             Anesthesia Physical Anesthesia Plan  ASA: 2  Anesthesia Plan: Epidural   Post-op Pain Management:    Induction:   PONV Risk Score and Plan: 2 and Treatment may vary due to age or medical condition  Airway Management Planned: Natural Airway  Additional Equipment: None  Intra-op Plan:   Post-operative Plan:   Informed Consent: I have reviewed the patients History and Physical, chart, labs and discussed the procedure including the risks, benefits and alternatives for the proposed anesthesia with the patient or authorized representative who has indicated his/her understanding and acceptance.       Plan Discussed with: Anesthesiologist  Anesthesia Plan Comments: (Labs reviewed. Platelets acceptable, patient not taking any blood thinning medications. Per RN, FHR tracing reported to be stable enough for sitting procedure. Risks and benefits discussed with patient, including PDPH, backache, epidural hematoma, failed epidural, blood pressure changes, allergic  reaction, and nerve injury. Patient expressed understanding and wished to proceed.)       Anesthesia Quick Evaluation

## 2022-05-22 LAB — CBC
HCT: 36.4 % (ref 36.0–46.0)
Hemoglobin: 12.3 g/dL (ref 12.0–15.0)
MCH: 28.1 pg (ref 26.0–34.0)
MCHC: 33.8 g/dL (ref 30.0–36.0)
MCV: 83.3 fL (ref 80.0–100.0)
Platelets: 268 10*3/uL (ref 150–400)
RBC: 4.37 MIL/uL (ref 3.87–5.11)
RDW: 15.1 % (ref 11.5–15.5)
WBC: 15 10*3/uL — ABNORMAL HIGH (ref 4.0–10.5)
nRBC: 0 % (ref 0.0–0.2)

## 2022-05-22 MED ORDER — IBUPROFEN 600 MG PO TABS: 600.0000 mg | ORAL_TABLET | Freq: Four times a day (QID) | ORAL | 0 refills | Status: AC | PRN

## 2022-05-22 NOTE — Lactation Note (Signed)
This note was copied from a baby's chart. Lactation Consultation Note  Patient Name: Shelley Pruitt Date: 05/22/2022 Age : 31 hours old / P2 ,  Reason for consult: Follow-up assessment 2nd [place] visit, mom called for Latch assessment as LC recommended.  Baby was noted be latched with depth, and then eased back off the breast to be shallow. LC tried to adjust and baby released and nipple noted to be creased at the edges.  [place] repositioned baby in the cross cradle with baby belly to breast with arms hugging the breast and increased depth obtained. More swallows noted and baby working her jaw. Per mom comfortable. Baby fed another 10 mins.  LC stressed the importance of STS feedings until the baby is back to birth weight, gaining steadily and can stay awake for majority of the feeding. LC stressed to make sure the baby isn't to warm while feeding.  Latch score 9  Baby had a 2nd stool, but HNV as of yet.    Maternal Data Has patient been taught Hand Expression?: Yes Does the patient have breastfeeding experience prior to this delivery?: Yes How long did the patient breastfeed?: per mom had plenty of milk in her freezer. After she went on birth control her milk dried up.  Feeding Mother's Current Feeding Choice: Breast Milk  LATCH Score Latch: Grasps breast easily, tongue down, lips flanged, rhythmical sucking.  Audible Swallowing: Spontaneous and intermittent  Type of Nipple: Everted at rest and after stimulation  Comfort (Breast/Nipple): Soft / non-tender  Hold (Positioning): Assistance needed to correctly position infant at breast and maintain latch.  LATCH Score: 9   Lactation Tools Discussed/Used    Interventions Interventions: Breast feeding basics reviewed;Assisted with latch;Breast massage;Hand express;Breast compression;Adjust position;Support pillows;Position options;Education;LC Services brochure  Discharge Discharge Education: Engorgement and breast  care;Warning signs for feeding baby Pump: DEBP;Manual;Personal WIC Program: No  Consult Status Consult Status: Complete Date: 05/22/22 Follow-up type: In-patient    [place] 05/22/2022, 3:43 PM

## 2022-05-22 NOTE — Progress Notes (Signed)
Post Partum Day 1 Subjective: no complaints, up ad lib, voiding, and tolerating PO  Objective: Blood pressure 133/76, pulse 77, temperature 98.3 F (36.8 C), temperature source Oral, resp. rate 18, height 5\' 3"  (1.6 m), weight 101.1 kg, SpO2 97 %, unknown if currently breastfeeding.  Physical Exam:  General: alert, cooperative, and appears stated age Lochia: appropriate Uterine Fundus: firm DVT Evaluation: No evidence of DVT seen on physical exam.  Recent Labs    05/21/22 1847 05/22/22 0343  HGB 14.0 12.3  HCT 42.9 36.4    Assessment/Plan: Breastfeeding Desires Discharge today, baby has appointment with peds tomorrow @ 10:15   LOS: 1 day   Vanessa Kick, MD 05/22/2022, 12:03 PM

## 2022-05-22 NOTE — Discharge Summary (Signed)
Postpartum Discharge Summary       Patient Name: Shelley Pruitt DOB: 1990/08/29 MRN: [id number]  Date of admission: 05/21/2022 Delivery date:05/21/2022  Delivering provider: Jerelyn Charles  Date of discharge: 05/22/2022  Admitting diagnosis: Benign essential hypertension antepartum in third trimester [O10.013] Intrauterine pregnancy: [redacted]w[redacted]d     Secondary diagnosis:  Principal Problem:   Benign essential hypertension antepartum in third trimester     Discharge diagnosis: Term Pregnancy Delivered and CHTN                                              Post partum procedures: NA Augmentation: AROM and Pitocin Complications: None  Hospital course: Induction of Labor With Vaginal Delivery   32 y.o. yo G2P1001 at [redacted]w[redacted]d was admitted to the hospital 05/21/2022 for induction of labor.  Indication for induction:  Chronic hypertension .  Patient had an labor course complicated by nothing Membrane Rupture Time/Date: 10:57 AM ,05/21/2022   Delivery Method:Vaginal, Spontaneous  Episiotomy:   Lacerations:  1st degree;Perineal;Vaginal  Details of delivery can be found in separate delivery note.  Patient had a postpartum course complicated by nothing. Patient is discharged home 05/22/22.  Newborn Data: Birth date:05/21/2022  Birth time:5:32 PM  Gender:Female  Living status:Living  Apgars:6 ,9  Weight:4010 g   Magnesium Sulfate received: No BMZ received: No Rhophylac:N/A  Physical exam  Vitals:   05/21/22 2115 05/22/22 0115 05/22/22 0530 05/22/22 0952  BP: 128/71 123/81 128/73 133/76  Pulse: (!) 108 89 81 77  Resp: 18 18 18 18   Temp: 98.7 F (37.1 C) 98 F (36.7 C) 98.3 F (36.8 C) 98.3 F (36.8 C)  TempSrc: Oral Oral Oral Oral  SpO2: 97% 99% 99% 97%  Weight:      Height:       General: alert, cooperative, and no distress Lochia: appropriate Uterine Fundus: firm DVT Evaluation: No evidence of DVT seen on physical exam. Labs: Lab Results  Component Value Date   WBC 15.0  (H) 05/22/2022   HGB 12.3 05/22/2022   HCT 36.4 05/22/2022   MCV 83.3 05/22/2022   PLT 268 05/22/2022      Latest Ref Rng & Units 05/21/2022    7:46 AM  CMP  Glucose 70 - 99 mg/dL 86   BUN 6 - 20 mg/dL 6   Creatinine 0.44 - 1.00 mg/dL 0.64   Sodium 135 - 145 mmol/L 135   Potassium 3.5 - 5.1 mmol/L 3.6   Chloride 98 - 111 mmol/L 105   CO2 22 - 32 mmol/L 20   Calcium 8.9 - 10.3 mg/dL 8.7   Total Protein 6.5 - 8.1 g/dL 6.1   Total Bilirubin 0.3 - 1.2 mg/dL 0.5   Alkaline Phos 38 - 126 U/L 94   AST 15 - 41 U/L 23   ALT 0 - 44 U/L 16    Edinburgh Score:    05/22/2022    9:52 AM  Edinburgh Postnatal Depression Scale Screening Tool  I have been able to laugh and see the funny side of things. 0  I have looked forward with enjoyment to things. 0  I have blamed myself unnecessarily when things went wrong. 1  I have been anxious or worried for no good reason. 2  I have felt scared or panicky for no good reason. 1  Things have been getting on top of  me. 0  I have been so unhappy that I have had difficulty sleeping. 0  I have felt sad or miserable. 0  I have been so unhappy that I have been crying. 0  The thought of harming myself has occurred to me. 0  Edinburgh Postnatal Depression Scale Total 4        Discharge home in stable condition Infant Feeding: Breast Infant Disposition:home with mother Discharge instruction: per After Visit Summary and Postpartum booklet. Activity: Advance as tolerated. Pelvic rest for 6 weeks.  Diet: routine diet Anticipated Birth Control: Unsure Postpartum Appointment:4 weeks Future Appointments:No future appointments. Follow up Visit:  [place], [facility]. Schedule an appointment as soon as possible for a visit in 4 week(s).   Why: For a postpartum appointment Contact information: [address] [place] [address] [phone]                     05/22/2022 Vanessa Kick,  MD

## 2022-05-22 NOTE — Lactation Note (Signed)
This note was copied from a baby's chart. Lactation Consultation Note  Patient Name: Shelley Pruitt [id number] Date: 05/22/2022 Age : 31 hours old  Reason for consult: Initial assessment;Term;Infant weight loss (0.55 % weight loss. per mom the baby recently fed 15 mins with swallows. Presently baby is asleep. LC recommended  for mom to call with feeding cues. Per mom the baby has not wet yet, stooled x 1) LC reviewed Breast feeding basics.  LC encouraged mom to call with feeding cues.  Maternal Data Has patient been taught Hand Expression?: Yes Does the patient have breastfeeding experience prior to this delivery?: Yes How long did the patient breastfeed?: per mom had plenty of milk in her freezer. After she went on birth control her milk dried up.  Feeding Mother's Current Feeding Choice: Breast Milk  LATCH Score - mom aware LC needs to do a Latch assessment    Lactation Tools Discussed/Used    Interventions Interventions: Breast feeding basics reviewed;Hand express;Education;LC Services brochure  Discharge Discharge Education: Engorgement and breast care;Warning signs for feeding baby Pump: DEBP;Manual;Personal WIC Program: No  Consult Status Consult Status: Follow-up Date: 05/22/22 Follow-up type: In-patient    [place] 05/22/2022, 2:43 PM

## 2022-05-22 NOTE — Anesthesia Postprocedure Evaluation (Signed)
Anesthesia Post Note  Patient: Shelley Pruitt  Procedure(s) Performed: AN AD HOC LABOR EPIDURAL     Patient location during evaluation: Mother Baby Anesthesia Type: Epidural Level of consciousness: awake, oriented and awake and alert Pain management: pain level not controlled Vital Signs Assessment: post-procedure vital signs reviewed and stable Respiratory status: spontaneous breathing, respiratory function stable and nonlabored ventilation Cardiovascular status: stable Postop Assessment: no headache, adequate PO intake, able to ambulate, patient able to bend at knees and no apparent nausea or vomiting Anesthetic complications: no   No notable events documented.  Last Vitals:  Vitals:   05/22/22 0530 05/22/22 0952  BP: 128/73 133/76  Pulse: 81 77  Resp: 18 18  Temp: 36.8 C 36.8 C  SpO2: 99% 97%    Last Pain:  Vitals:   05/22/22 0952  TempSrc: Oral  PainSc: 0-No pain   Pain Goal:                Epidural/Spinal Function Cutaneous sensation: Normal sensation (05/22/22 0952), Patient able to flex knees: Yes (05/22/22 0952), Patient able to lift hips off bed: Yes (05/22/22 0952), Back pain beyond tenderness at insertion site: No (05/22/22 0952), Progressively worsening motor and/or sensory loss: No (05/22/22 0952), Bowel and/or bladder incontinence post epidural: No (05/22/22 0952)  Sidda Humm

## 2022-05-22 NOTE — Social Work (Signed)
MOB was referred for history of anxiety.  * Referral screened out by Clinical Social Worker because none of the following criteria appear to apply:  ~ History of anxiety/depression during this pregnancy, or of post-partum depression following prior delivery. Per OB records, no noted symptoms during this pregnancy.  ~ Diagnosis of anxiety and/or depression within last 3 years. MOB Anxiety dates back to her time in college. OR * MOB's symptoms currently being treated with medication and/or therapy.  Please contact the Clinical Social Worker if needs arise, by [facility] request, or if MOB scores greater than 9/yes to question 10 on Edinburgh Postpartum Depression Screen.  Letta Kocher, [place] Social Worker [phone]

## 2022-05-28 ENCOUNTER — Telehealth (HOSPITAL_COMMUNITY): Payer: Self-pay | Admitting: *Deleted

## 2022-05-28 NOTE — Telephone Encounter (Signed)
Attempted hospital discharge follow-up call. Left message for patient to return RN call with any questions or concerns. Erline Levine, RN, 05/28/22, [phone]
